# Patient Record
Sex: Female | Born: 1960 | Race: White | Hispanic: No | Marital: Married | Smoking: Never smoker
Health system: Southern US, Community
[De-identification: ages and names within clinical notes are randomized; demographics above are authoritative.]

## PROBLEM LIST (undated history)

## (undated) DIAGNOSIS — M8580 Other specified disorders of bone density and structure, unspecified site: Secondary | ICD-10-CM

## (undated) DIAGNOSIS — E559 Vitamin D deficiency, unspecified: Secondary | ICD-10-CM

## (undated) DIAGNOSIS — K5792 Diverticulitis of intestine, part unspecified, without perforation or abscess without bleeding: Secondary | ICD-10-CM

## (undated) HISTORY — PX: COLPOSCOPY: SHX161

## (undated) HISTORY — PX: KNEE SURGERY: SHX244

## (undated) HISTORY — DX: Other specified disorders of bone density and structure, unspecified site: M85.80

## (undated) HISTORY — DX: Vitamin D deficiency, unspecified: E55.9

## (undated) HISTORY — PX: FOOT SURGERY: SHX648

## (undated) HISTORY — PX: EXTERNAL EAR SURGERY: SHX627

---

## 2006-06-10 HISTORY — PX: BREAST EXCISIONAL BIOPSY: SUR124

## 2014-03-17 DIAGNOSIS — J309 Allergic rhinitis, unspecified: Secondary | ICD-10-CM | POA: Insufficient documentation

## 2014-03-17 DIAGNOSIS — R030 Elevated blood-pressure reading, without diagnosis of hypertension: Secondary | ICD-10-CM | POA: Insufficient documentation

## 2014-03-17 DIAGNOSIS — D72819 Decreased white blood cell count, unspecified: Secondary | ICD-10-CM | POA: Insufficient documentation

## 2014-03-17 DIAGNOSIS — G4700 Insomnia, unspecified: Secondary | ICD-10-CM | POA: Insufficient documentation

## 2014-03-17 DIAGNOSIS — K5900 Constipation, unspecified: Secondary | ICD-10-CM | POA: Insufficient documentation

## 2014-03-17 DIAGNOSIS — D126 Benign neoplasm of colon, unspecified: Secondary | ICD-10-CM | POA: Insufficient documentation

## 2014-03-17 DIAGNOSIS — R143 Flatulence: Secondary | ICD-10-CM | POA: Insufficient documentation

## 2014-03-17 DIAGNOSIS — F32A Depression, unspecified: Secondary | ICD-10-CM | POA: Insufficient documentation

## 2014-03-17 DIAGNOSIS — R350 Frequency of micturition: Secondary | ICD-10-CM | POA: Insufficient documentation

## 2014-03-17 DIAGNOSIS — R928 Other abnormal and inconclusive findings on diagnostic imaging of breast: Secondary | ICD-10-CM | POA: Insufficient documentation

## 2014-03-17 DIAGNOSIS — K589 Irritable bowel syndrome without diarrhea: Secondary | ICD-10-CM | POA: Insufficient documentation

## 2014-03-17 DIAGNOSIS — R197 Diarrhea, unspecified: Secondary | ICD-10-CM | POA: Insufficient documentation

## 2014-06-22 DIAGNOSIS — E559 Vitamin D deficiency, unspecified: Secondary | ICD-10-CM | POA: Insufficient documentation

## 2016-02-14 DIAGNOSIS — R9230 Dense breasts, unspecified: Secondary | ICD-10-CM | POA: Insufficient documentation

## 2016-02-14 DIAGNOSIS — Z9289 Personal history of other medical treatment: Secondary | ICD-10-CM | POA: Insufficient documentation

## 2017-05-12 ENCOUNTER — Other Ambulatory Visit: Payer: Self-pay

## 2017-05-12 ENCOUNTER — Emergency Department (HOSPITAL_BASED_OUTPATIENT_CLINIC_OR_DEPARTMENT_OTHER): Payer: BLUE CROSS/BLUE SHIELD

## 2017-05-12 ENCOUNTER — Encounter (HOSPITAL_BASED_OUTPATIENT_CLINIC_OR_DEPARTMENT_OTHER): Payer: Self-pay | Admitting: *Deleted

## 2017-05-12 ENCOUNTER — Emergency Department (HOSPITAL_BASED_OUTPATIENT_CLINIC_OR_DEPARTMENT_OTHER)
Admission: EM | Admit: 2017-05-12 | Discharge: 2017-05-12 | Disposition: A | Discharge status: Home / Self Care | Payer: BLUE CROSS/BLUE SHIELD | Attending: Emergency Medicine | Admitting: Emergency Medicine

## 2017-05-12 DIAGNOSIS — K529 Noninfective gastroenteritis and colitis, unspecified: Secondary | ICD-10-CM | POA: Insufficient documentation

## 2017-05-12 DIAGNOSIS — K5792 Diverticulitis of intestine, part unspecified, without perforation or abscess without bleeding: Secondary | ICD-10-CM | POA: Insufficient documentation

## 2017-05-12 DIAGNOSIS — R1031 Right lower quadrant pain: Secondary | ICD-10-CM | POA: Diagnosis present

## 2017-05-12 HISTORY — DX: Diverticulitis of intestine, part unspecified, without perforation or abscess without bleeding: K57.92

## 2017-05-12 LAB — COMPREHENSIVE METABOLIC PANEL
ALBUMIN: 4.7 g/dL (ref 3.5–5.0)
ALK PHOS: 66 U/L (ref 38–126)
ALT: 20 U/L (ref 14–54)
AST: 31 U/L (ref 15–41)
Anion gap: 9 (ref 5–15)
BUN: 13 mg/dL (ref 6–20)
CALCIUM: 9.5 mg/dL (ref 8.9–10.3)
CO2: 27 mmol/L (ref 22–32)
CREATININE: 0.64 mg/dL (ref 0.44–1.00)
Chloride: 100 mmol/L — ABNORMAL LOW (ref 101–111)
GFR calc Af Amer: >60 mL/min (ref >60)
GFR calc non Af Amer: >60 mL/min (ref >60)
GLUCOSE: 97 mg/dL (ref 65–99)
Potassium: 3.9 mmol/L (ref 3.5–5.1)
SODIUM: 136 mmol/L (ref 135–145)
TOTAL PROTEIN: 7.9 g/dL (ref 6.5–8.1)
Total Bilirubin: 0.6 mg/dL (ref 0.3–1.2)

## 2017-05-12 LAB — LIPASE, BLOOD: Lipase: 28 U/L (ref 11–51)

## 2017-05-12 LAB — CBC
HCT: 38.2 % (ref 36.0–46.0)
Hemoglobin: 12.4 g/dL (ref 12.0–15.0)
MCH: 32.6 pg (ref 26.0–34.0)
MCHC: 32.5 g/dL (ref 30.0–36.0)
MCV: 100.5 fL — ABNORMAL HIGH (ref 78.0–100.0)
PLATELETS: 258 10*3/uL (ref 150–400)
RBC: 3.8 MIL/uL — ABNORMAL LOW (ref 3.87–5.11)
RDW: 12.8 % (ref 11.5–15.5)
WBC: 7.8 10*3/uL (ref 4.0–10.5)

## 2017-05-12 LAB — URINALYSIS, ROUTINE W REFLEX MICROSCOPIC
BILIRUBIN URINE: NEGATIVE
Glucose, UA: NEGATIVE mg/dL
HGB URINE DIPSTICK: NEGATIVE
Ketones, ur: NEGATIVE mg/dL
Leukocytes, UA: NEGATIVE
NITRITE: NEGATIVE
PROTEIN: NEGATIVE mg/dL
Specific Gravity, Urine: 1.01 (ref 1.005–1.030)
pH: 7 (ref 5.0–8.0)

## 2017-05-12 MED ORDER — CIPROFLOXACIN HCL 500 MG PO TABS
500.0000 mg | ORAL_TABLET | Freq: Once | ORAL | Status: AC
  Administered 2017-05-12: 500 mg via ORAL
  Filled 2017-05-12: qty 1

## 2017-05-12 MED ORDER — ACETAMINOPHEN 500 MG PO TABS
1000.0000 mg | ORAL_TABLET | Freq: Once | ORAL | Status: AC
  Administered 2017-05-12: 1000 mg via ORAL
  Filled 2017-05-12: qty 2

## 2017-05-12 MED ORDER — SODIUM CHLORIDE 0.9 % IV BOLUS (SEPSIS)
1000.0000 mL | Freq: Once | INTRAVENOUS | Status: AC
  Administered 2017-05-12: 1000 mL via INTRAVENOUS

## 2017-05-12 MED ORDER — METRONIDAZOLE 500 MG PO TABS: 500.0000 mg | ORAL_TABLET | Freq: Two times a day (BID) | ORAL | 0 refills | Status: DC

## 2017-05-12 MED ORDER — CIPROFLOXACIN HCL 500 MG PO TABS: 500.0000 mg | ORAL_TABLET | Freq: Two times a day (BID) | ORAL | 0 refills | Status: DC

## 2017-05-12 MED ORDER — IOPAMIDOL (ISOVUE-300) INJECTION 61%
100.0000 mL | Freq: Once | INTRAVENOUS | Status: AC | PRN
  Administered 2017-05-12: 100 mL via INTRAVENOUS

## 2017-05-12 MED ORDER — METRONIDAZOLE 500 MG PO TABS
500.0000 mg | ORAL_TABLET | Freq: Once | ORAL | Status: AC
  Administered 2017-05-12: 500 mg via ORAL
  Filled 2017-05-12: qty 1

## 2017-05-12 NOTE — ED Notes (Signed)
Returned from CT.

## 2017-05-12 NOTE — ED Notes (Signed)
Patient transported to CT 

## 2017-05-12 NOTE — ED Provider Notes (Signed)
[facility] EMERGENCY DEPARTMENT Provider Note   CSN: [id number] Arrival date & time: 05/12/17  1830     History   Chief Complaint Chief Complaint  Patient presents with  . Abdominal Pain    HPI Shelby Patel is a 56 y.o. female history of diverticula on colonoscopy, he presented with right lower quadrant pain.  Patient has right lower quadrant pain since earlier today.  Patient had some constipation as well.  Patient has some fever and chills and felt nauseated.  Patient did have a colonoscopy before that showed diverticula and she had diverticulitis several years ago.  Denies any sick contacts or recent travel.   The history is provided by the patient.    Past Medical History:  Diagnosis Date  . Diverticulitis     There are no active problems to display for this patient.   Past Surgical History:  Procedure Laterality Date  . KNEE SURGERY      OB History    No data available       Home Medications    Prior to Admission medications   Not on File    Family History No family history on file.  Social History Social History   Tobacco Use  . Smoking status: Never Smoker  . Smokeless tobacco: Never Used  Substance Use Topics  . Alcohol use: Yes  . Drug use: No     Allergies   Penicillins   Review of Systems Review of Systems  Gastrointestinal: Positive for abdominal pain.  All other systems reviewed and are negative.    Physical Exam Updated Vital Signs BP 132/82 (BP Location: Left Arm)   Pulse 77   Temp 98.7 F (37.1 C) (Oral)   Resp 18   Ht 5\' 8"  (1.727 m)   Wt 58.5 kg (129 lb)   SpO2 99%   BMI 19.61 kg/m   Physical Exam  Constitutional: She appears well-developed and well-nourished.  HENT:  Head: Normocephalic.  Eyes: Pupils are equal, round, and reactive to light.  Cardiovascular: Normal rate.  Pulmonary/Chest: Effort normal.  Abdominal: Normal appearance and bowel sounds are normal. There is tenderness in the right  lower quadrant. There is tenderness at McBurney's point. There is no rebound and no guarding.  Neurological: She is alert.  Skin: Skin is warm. Capillary refill takes less than 2 seconds.  Nursing note and vitals reviewed.    ED Treatments / Results  Labs (all labs ordered are listed, but only abnormal results are displayed) Labs Reviewed  COMPREHENSIVE METABOLIC PANEL - Abnormal; Notable for the following components:      Result Value   Chloride 100 (*)    All other components within normal limits  CBC - Abnormal; Notable for the following components:   RBC 3.80 (*)    MCV 100.5 (*)    All other components within normal limits  LIPASE, BLOOD  URINALYSIS, ROUTINE W REFLEX MICROSCOPIC    EKG  EKG Interpretation None       Radiology Ct Abdomen Pelvis W Contrast  Result Date: 05/12/2017 CLINICAL DATA:  56 year old female with lower abdominal pain and fever today. Personal history of diverticulitis. EXAM: CT ABDOMEN AND PELVIS WITH CONTRAST TECHNIQUE: Multidetector CT imaging of the abdomen and pelvis was performed using the standard protocol following bolus administration of intravenous contrast. CONTRAST:  100mL ISOVUE-300 IOPAMIDOL (ISOVUE-300) INJECTION 61% COMPARISON:  None. FINDINGS: Lower chest: Negative lung bases. No pericardial or pleural effusion. Hepatobiliary: Negative liver and gallbladder. Pancreas: Negative. Spleen: Negative.  Adrenals/Urinary Tract: Normal adrenal glands. Bilateral renal enhancement and contrast excretion is symmetric and within normal limits. No dilatation of the left ureter. Unremarkable urinary bladder. Stomach/Bowel: Negative rectum.  Redundant sigmoid colon. Severe wall thickening in the proximal sigmoid and surrounding mesenteric inflammation along a segment of about 9 cm. Sigmoid wall thickening up to 15 mm. There is at least 1 relatively large sigmoid diverticula in the region (sagittal image 51). No extraluminal gas identified. At the junction  with the descending colon the inflammation abates. There are multiple small diverticula in the region which continue into the distal left colon. Increased retained stool in the left colon and transverse colon with redundant splenic flexure. Redundant hepatic flexure with retained stool. Oral contrast has reached the ascending colon. Negative terminal ileum. Normal appendix (coronal image 24). No other large bowel inflammation identified. No dilated or inflamed small bowel loops. Decompressed and negative stomach and duodenum. No abdominal free fluid. No free air identified. Vascular/Lymphatic: Major arterial structures in the abdomen and pelvis are patent. Major venous structures also appear patent. The portal venous system appears patent. No lymphadenopathy. Reproductive: Retroverted uterus. The left ovary is in proximity to the abnormal sigmoid colon but appears to remain normal. Uterus and right ovary appear negative. Other: Small volume pelvic free fluid. Musculoskeletal: Mild levoconvex lumbar curvature. No acute osseous abnormality identified. IMPRESSION: 1. Severely inflamed 9 cm segment of the proximal sigmoid colon. Diverticulae in the region and also moderate to severe circumferential colonic wall thickening. Appearance compatible with combined acute diverticulitis and colitis. 2. Small volume of probably reactive free fluid in the pelvis. No evidence of perforation or abscess at this time. 3. No other acute or inflammatory process identified. Electronically Signed   By: Odessa FlemingH  Hall M.D.   On: 05/12/2017 22:20    Procedures Procedures (including critical care time)  Medications Ordered in ED Medications  acetaminophen (TYLENOL) tablet 1,000 mg (1,000 mg Oral Given 05/12/17 1929)  sodium chloride 0.9 % bolus 1,000 mL (0 mLs Intravenous Stopped 05/12/17 2237)  iopamidol (ISOVUE-300) 61 % injection 100 mL (100 mLs Intravenous Contrast Given 05/12/17 2132)  ciprofloxacin (CIPRO) tablet 500 mg (500 mg Oral  Given 05/12/17 2236)  metroNIDAZOLE (FLAGYL) tablet 500 mg (500 mg Oral Given 05/12/17 2236)     Initial Impression / Assessment and Plan / ED Course  I have reviewed the triage vital signs and the nursing notes.  Pertinent labs & imaging results that were available during my care of the patient were reviewed by me and considered in my medical decision making (see chart for details).     Shelby CallanderJanet Group is a 56 y.o. female here with RLQ pain, constipation. Consider appy vs diverticulitis vs gastro. Will get labs, UA, CT ab/pel.   10:50 PM WBC nl. CT showed proximal sigmoid colon inflammation with colitis and diverticulitis and small free fluid with no perforation. Pain controlled currently. She has seen GI in the past. She was told that she may have Crohn's. Will try cipro, flagyl. Will have her follow up with GI for another colonoscopy    Final Clinical Impressions(s) / ED Diagnoses   Final diagnoses:  None    ED Discharge Orders    None       Charlynne PanderYao, Notnamed Croucher Hsienta, MD 05/12/17 2253

## 2017-05-12 NOTE — ED Triage Notes (Signed)
Lower abdominal pain today. Fever. 2 abnormal BM's today. Hx of diverticulitis.

## 2017-05-12 NOTE — Discharge Instructions (Signed)
Take cipro, flagyl twice daily for a week for diverticulitis.   Take tylenol, motrin for pain.   Please follow up with your GI doctor. You likely need another colonoscopy and workup for possible Crohn's disease   Return to ER if you have severe abdominal pain, vomiting, fever.

## 2019-06-27 ENCOUNTER — Emergency Department (INDEPENDENT_AMBULATORY_CARE_PROVIDER_SITE_OTHER)
Admission: EM | Admit: 2019-06-27 | Discharge: 2019-06-27 | Disposition: A | Discharge status: Home / Self Care | Payer: BC Managed Care – PPO | Source: Home / Self Care | Attending: Family Medicine | Admitting: Family Medicine

## 2019-06-27 ENCOUNTER — Encounter: Payer: Self-pay | Admitting: Emergency Medicine

## 2019-06-27 ENCOUNTER — Other Ambulatory Visit: Payer: Self-pay

## 2019-06-27 DIAGNOSIS — R300 Dysuria: Secondary | ICD-10-CM | POA: Diagnosis not present

## 2019-06-27 DIAGNOSIS — N3090 Cystitis, unspecified without hematuria: Secondary | ICD-10-CM

## 2019-06-27 LAB — POCT URINALYSIS DIP (MANUAL ENTRY)
Bilirubin, UA: NEGATIVE
Glucose, UA: NEGATIVE mg/dL
Ketones, POC UA: NEGATIVE mg/dL
Nitrite, UA: NEGATIVE
Protein Ur, POC: NEGATIVE mg/dL
Spec Grav, UA: 1.02 (ref 1.010–1.025)
Urobilinogen, UA: 0.2 E.U./dL
pH, UA: 7.5 (ref 5.0–8.0)

## 2019-06-27 MED ORDER — NITROFURANTOIN MONOHYD MACRO 100 MG PO CAPS: 100.0000 mg | ORAL_CAPSULE | Freq: Two times a day (BID) | ORAL | 0 refills | Status: DC

## 2019-06-27 NOTE — Discharge Instructions (Addendum)
Increase fluid intake. May use non-prescription AZO for about two days, if desired, to decrease urinary discomfort.  If symptoms become significantly worse during the night or over the weekend, proceed to the local emergency room.  

## 2019-06-27 NOTE — ED Triage Notes (Signed)
Patient reports onset of urinary frequency with some burning upon urination starting yesterday. This is rare for her. Has had influenza vacc this season. No known exposure to covid positive person.

## 2019-06-27 NOTE — ED Provider Notes (Signed)
[facility]    CSN: [id number] Arrival date & time: 06/27/19  1044      History   Chief Complaint Chief Complaint  Patient presents with  . Urinary Frequency  . Dysuria    HPI Shelby Patel is a 59 y.o. female.   Patient developed urinary frequency, dysuria, and low abdominal pressure sensation yesterday.  She denies fevers, chills, and sweats, back pain, and nausea/vomiting.  The history is provided by the patient.  Dysuria Pain quality:  Burning Pain severity:  Mild Onset quality:  Sudden Duration:  1 day Timing:  Constant Progression:  Worsening Chronicity:  New Recent urinary tract infections: no   Relieved by:  Nothing Worsened by:  Nothing Ineffective treatments:  None tried Urinary symptoms: frequent urination and hesitancy   Urinary symptoms: no discolored urine, no foul-smelling urine, no hematuria and no bladder incontinence   Associated symptoms: no abdominal pain, no fever, no flank pain, no genital lesions, no nausea, no vaginal discharge and no vomiting     Past Medical History:  Diagnosis Date  . Diverticulitis     There are no problems to display for this patient.   Past Surgical History:  Procedure Laterality Date  . EXTERNAL EAR SURGERY Left   . FOOT SURGERY Bilateral   . KNEE SURGERY    . KNEE SURGERY Right     OB History   No obstetric history on file.      Home Medications    Prior to Admission medications   Medication Sig Start Date End Date Taking? Authorizing Provider  metroNIDAZOLE (FLAGYL) 500 MG tablet Take 1 tablet (500 mg total) by mouth 2 (two) times daily. One po bid x 7 days 05/12/17   Charlynne Pander, MD  nitrofurantoin, macrocrystal-monohydrate, (MACROBID) 100 MG capsule Take 1 capsule (100 mg total) by mouth 2 (two) times daily. Take with food. 06/27/19   Lattie Haw, MD    Family History No family history on file.  Social History Social History   Tobacco Use  . Smoking status: Never  Smoker  . Smokeless tobacco: Never Used  Substance Use Topics  . Alcohol use: Yes  . Drug use: No     Allergies   Penicillins   Review of Systems Review of Systems  Constitutional: Negative for chills, diaphoresis, fatigue and fever.  Gastrointestinal: Negative for abdominal pain, nausea and vomiting.  Genitourinary: Positive for dysuria, frequency and urgency. Negative for flank pain and vaginal discharge.  All other systems reviewed and are negative.    Physical Exam Triage Vital Signs ED Triage Vitals  Enc Vitals Group     BP 06/27/19 1129 (!) 167/89     Pulse Rate 06/27/19 1129 61     Resp 06/27/19 1129 16     Temp 06/27/19 1129 98 F (36.7 C)     Temp Source 06/27/19 1129 Oral     SpO2 06/27/19 1129 98 %     Weight 06/27/19 1130 120 lb (54.4 kg)     Height 06/27/19 1130 5\' 8"  (1.727 m)     Head Circumference --      Peak Flow --      Pain Score 06/27/19 1129 1     Pain Loc --      Pain Edu? --      Excl. in GC? --    No data found.  Updated Vital Signs BP (!) 167/89 (BP Location: Right Arm)   Pulse 61   Temp 98 F (  36.7 C) (Oral)   Resp 16   Ht 5\' 8"  (1.727 m)   Wt 54.4 kg   SpO2 98%   BMI 18.25 kg/m   Visual Acuity Right Eye Distance:   Left Eye Distance:   Bilateral Distance:    Right Eye Near:   Left Eye Near:    Bilateral Near:     Physical Exam Nursing notes and Vital Signs reviewed. Appearance:  Patient appears stated age, and in no acute distress.    Eyes:  Pupils are equal, round, and reactive to light and accomodation.  Extraocular movement is intact.  Conjunctivae are not inflamed   Pharynx:  Normal; moist mucous membranes  Neck:  Supple.  No adenopathy Lungs:  Clear to auscultation.  Breath sounds are equal.  Moving air well. Heart:  Regular rate and rhythm without murmurs, rubs, or gallops.  Abdomen:  Nontender without masses or hepatosplenomegaly.  Bowel sounds are present.  No CVA or flank tenderness.  Extremities:  No edema.   Skin:  No rash present.     UC Treatments / Results  Labs (all labs ordered are listed, but only abnormal results are displayed) Labs Reviewed  POCT URINALYSIS DIP (MANUAL ENTRY) - Abnormal; Notable for the following components:      Result Value   Color, UA light yellow (*)    Blood, UA trace-intact (*)    Leukocytes, UA Trace (*)    All other components within normal limits  URINE CULTURE    EKG   Radiology No results found.  Procedures Procedures (including critical care time)  Medications Ordered in UC Medications - No data to display  Initial Impression / Assessment and Plan / UC Course  I have reviewed the triage vital signs and the nursing notes.  Pertinent labs & imaging results that were available during my care of the patient were reviewed by me and considered in my medical decision making (see chart for details).    Urine culture pending. Begin Macrobid. Followup with Family Doctor if not improved in about 8 days.   Final Clinical Impressions(s) / UC Diagnoses   Final diagnoses:  Dysuria  Cystitis     Discharge Instructions     Increase fluid intake. May use non-prescription AZO for about two days, if desired, to decrease urinary discomfort.   If symptoms become significantly worse during the night or over the weekend, proceed to the local emergency room.    ED Prescriptions    Medication Sig Dispense Auth. Provider   nitrofurantoin, macrocrystal-monohydrate, (MACROBID) 100 MG capsule Take 1 capsule (100 mg total) by mouth 2 (two) times daily. Take with food. 14 capsule Kandra Nicolas, MD        Kandra Nicolas, MD 07/02/19 [phone]

## 2019-06-28 LAB — URINE CULTURE
MICRO NUMBER:: 10051637
SPECIMEN QUALITY:: ADEQUATE

## 2019-07-05 ENCOUNTER — Emergency Department (HOSPITAL_BASED_OUTPATIENT_CLINIC_OR_DEPARTMENT_OTHER)
Admission: EM | Admit: 2019-07-05 | Discharge: 2019-07-05 | Disposition: A | Discharge status: Home / Self Care | Payer: BC Managed Care – PPO | Attending: Emergency Medicine | Admitting: Emergency Medicine

## 2019-07-05 ENCOUNTER — Other Ambulatory Visit: Payer: Self-pay

## 2019-07-05 ENCOUNTER — Encounter (HOSPITAL_BASED_OUTPATIENT_CLINIC_OR_DEPARTMENT_OTHER): Payer: Self-pay | Admitting: *Deleted

## 2019-07-05 ENCOUNTER — Emergency Department (HOSPITAL_BASED_OUTPATIENT_CLINIC_OR_DEPARTMENT_OTHER): Payer: BC Managed Care – PPO

## 2019-07-05 DIAGNOSIS — R1031 Right lower quadrant pain: Secondary | ICD-10-CM | POA: Insufficient documentation

## 2019-07-05 DIAGNOSIS — R1032 Left lower quadrant pain: Secondary | ICD-10-CM | POA: Insufficient documentation

## 2019-07-05 DIAGNOSIS — Z880 Allergy status to penicillin: Secondary | ICD-10-CM | POA: Diagnosis not present

## 2019-07-05 DIAGNOSIS — M545 Low back pain, unspecified: Secondary | ICD-10-CM

## 2019-07-05 DIAGNOSIS — R350 Frequency of micturition: Secondary | ICD-10-CM | POA: Diagnosis not present

## 2019-07-05 DIAGNOSIS — R1030 Lower abdominal pain, unspecified: Secondary | ICD-10-CM

## 2019-07-05 LAB — CBC WITH DIFFERENTIAL/PLATELET
Abs Immature Granulocytes: 0.03 10*3/uL (ref 0.00–0.07)
Basophils Absolute: 0 10*3/uL (ref 0.0–0.1)
Basophils Relative: 0 %
Eosinophils Absolute: 0 10*3/uL (ref 0.0–0.5)
Eosinophils Relative: 0 %
HCT: 40.7 % (ref 36.0–46.0)
Hemoglobin: 13.1 g/dL (ref 12.0–15.0)
Immature Granulocytes: 0 %
Lymphocytes Relative: 2 %
Lymphs Abs: 0.2 10*3/uL — ABNORMAL LOW (ref 0.7–4.0)
MCH: 32.9 pg (ref 26.0–34.0)
MCHC: 32.2 g/dL (ref 30.0–36.0)
MCV: 102.3 fL — ABNORMAL HIGH (ref 80.0–100.0)
Monocytes Absolute: 0.4 10*3/uL (ref 0.1–1.0)
Monocytes Relative: 5 %
Neutro Abs: 8.4 10*3/uL — ABNORMAL HIGH (ref 1.7–7.7)
Neutrophils Relative %: 93 %
Platelets: 248 10*3/uL (ref 150–400)
RBC: 3.98 MIL/uL (ref 3.87–5.11)
RDW: 12.1 % (ref 11.5–15.5)
WBC: 9.1 10*3/uL (ref 4.0–10.5)
nRBC: 0 % (ref 0.0–0.2)

## 2019-07-05 LAB — BASIC METABOLIC PANEL
Anion gap: 9 (ref 5–15)
BUN: 14 mg/dL (ref 6–20)
CO2: 26 mmol/L (ref 22–32)
Calcium: 9.7 mg/dL (ref 8.9–10.3)
Chloride: 99 mmol/L (ref 98–111)
Creatinine, Ser: 0.61 mg/dL (ref 0.44–1.00)
GFR calc Af Amer: >60 mL/min (ref >60)
GFR calc non Af Amer: >60 mL/min (ref >60)
Glucose, Bld: 122 mg/dL — ABNORMAL HIGH (ref 70–99)
Potassium: 4.2 mmol/L (ref 3.5–5.1)
Sodium: 134 mmol/L — ABNORMAL LOW (ref 135–145)

## 2019-07-05 LAB — URINALYSIS, ROUTINE W REFLEX MICROSCOPIC
Bilirubin Urine: NEGATIVE
Glucose, UA: NEGATIVE mg/dL
Ketones, ur: 15 mg/dL — AB
Leukocytes,Ua: NEGATIVE
Nitrite: NEGATIVE
Protein, ur: NEGATIVE mg/dL
Specific Gravity, Urine: 1.01 (ref 1.005–1.030)
pH: 6 (ref 5.0–8.0)

## 2019-07-05 LAB — URINALYSIS, MICROSCOPIC (REFLEX)

## 2019-07-05 MED ORDER — ACETAMINOPHEN 325 MG PO TABS
650.0000 mg | ORAL_TABLET | Freq: Once | ORAL | Status: AC
Start: 2019-07-05 — End: 2019-07-05
  Administered 2019-07-05: 15:00:00 650 mg via ORAL
  Filled 2019-07-05: qty 2

## 2019-07-05 MED ORDER — IOHEXOL 300 MG/ML  SOLN
100.0000 mL | Freq: Once | INTRAMUSCULAR | Status: AC | PRN
  Administered 2019-07-05: 100 mL via INTRAVENOUS

## 2019-07-05 NOTE — Discharge Instructions (Addendum)
As discussed, all of your labs and CT scan looked good. Your urine did not show signs of a urinary tract infection. 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 If you continue to have pain, you may want to follow-up with your OBGYN. Finish taking your antibiotic for your UTI. Make sure to report to your GI appointment on Wednesday and discuss your pain with them. You may take over the counter ibuprofen or Tylenol as needed for pain. Return to the ER for new or worsening symptoms.

## 2019-07-05 NOTE — ED Provider Notes (Signed)
[facility] Provider Note   CSN: [id number] Arrival date & time: 07/05/19  1314     History Chief Complaint  Patient presents with   Abdominal Pain    Shelby Patel is a 59 y.o. female with a past medical history significant for diverticulitis who presents to the ED due to sudden onset of bilateral abdominal pain that radiates into her low back that started this morning around 6:30AM.  Patient describes the pain as a dull and achy sensation and rates it an 8.5/10.  She admits to having a history of diverticulitis and notes this episode feels similar.  Patient denies vomiting and diarrhea, but admits to intermittent nausea.  Chart reviewed.  Patient was seen at [facility] urgent care on 06/27/2019 where she was diagnosed with a urinary tract infection and prescribed Macrobid.  Patient states she skipped roughly 3 days of her antibiotic; however, took 1 pill early this morning when she began to feel her abdominal pain.  She notes her urinary symptoms completely resolved which is why she stopped the antibiotics.  Patient denies dysuria, but admits to increased urinary frequency which she attributes to drinking more fluids.  Patient admits to having 4 previous colonoscopies which were significant for polyps her last one being roughly 5 years ago.  Patient has a scheduled GI appointment on Wednesday.  Patient also admits to a bilateral frontal headache and fever.  T-max 100.7 earlier this morning. Patient denies hematochezia, melena, and vaginal symptoms. She is currently sexually active with her husband with no concern for STDs at this time. Patient denies history of kidney stones. Patient had COVID early December of last year. No treatment prior to arrival. Patient denies saddle paresthesias, numbness/tingling of lower extremities, lower extremity weakness, and bowel/bladder incontinence.      Past Medical History:  Diagnosis Date   Diverticulitis     There are no  problems to display for this patient.   Past Surgical History:  Procedure Laterality Date   EXTERNAL EAR SURGERY Left    FOOT SURGERY Bilateral    KNEE SURGERY     KNEE SURGERY Right      OB History   No obstetric history on file.     No family history on file.  Social History   Tobacco Use   Smoking status: Never Smoker   Smokeless tobacco: Never Used  Substance Use Topics   Alcohol use: Yes   Drug use: No    Home Medications Prior to Admission medications   Medication Sig Start Date End Date Taking? Authorizing Provider  nitrofurantoin, macrocrystal-monohydrate, (MACROBID) 100 MG capsule Take 1 capsule (100 mg total) by mouth 2 (two) times daily. Take with food. 06/27/19  Yes Kandra Nicolas, MD  metroNIDAZOLE (FLAGYL) 500 MG tablet Take 1 tablet (500 mg total) by mouth 2 (two) times daily. One po bid x 7 days 05/12/17   Drenda Freeze, MD    Allergies    Penicillins  Review of Systems   Review of Systems  Constitutional: Positive for chills and fever.  Respiratory: Negative for cough and shortness of breath.   Cardiovascular: Negative for chest pain and leg swelling.  Gastrointestinal: Positive for abdominal pain and nausea. Negative for diarrhea and vomiting.  Genitourinary: Positive for frequency. Negative for dysuria, vaginal bleeding, vaginal discharge and vaginal pain.  Musculoskeletal: Positive for back pain (bilateral low back pain).  Neurological: Positive for headaches. Negative for weakness.  All other systems reviewed and are negative.  Physical Exam Updated Vital Signs BP (!) 152/100 (BP Location: Right Arm)    Pulse 93    Temp 100.1 F (37.8 C) (Oral)    Resp 15    Ht 5\' 8"  (1.727 m)    Wt 54.4 kg    SpO2 98%    BMI 18.25 kg/m   Physical Exam Vitals and nursing note reviewed.  Constitutional:      General: She is not in acute distress.    Appearance: She is not ill-appearing.  HENT:     Head: Normocephalic.  Eyes:     Pupils:  Pupils are equal, round, and reactive to light.  Cardiovascular:     Rate and Rhythm: Normal rate and regular rhythm.     Pulses: Normal pulses.     Heart sounds: Normal heart sounds. No murmur. No friction rub. No gallop.   Pulmonary:     Effort: Pulmonary effort is normal.     Breath sounds: Normal breath sounds.  Abdominal:     General: Abdomen is flat. Bowel sounds are normal. There is no distension.     Palpations: Abdomen is soft.     Tenderness: There is abdominal tenderness. There is no right CVA tenderness, left CVA tenderness, guarding or rebound.     Comments: Very mild generalized abdominal tenderness. No focal tenderness. No rebound. No guarding. No CVA tenderness bilaterally.   Musculoskeletal:     Cervical back: Neck supple.     Comments: No thoracic or lumbar midline tenderness. Bilateral lumbar paraspinal tenderness. Distal pulses and sensation intact.   Skin:    General: Skin is warm and dry.  Neurological:     General: No focal deficit present.     Mental Status: She is alert.  Psychiatric:        Mood and Affect: Mood normal.        Behavior: Behavior normal.     ED Results / Procedures / Treatments   Labs (all labs ordered are listed, but only abnormal results are displayed) Labs Reviewed  URINALYSIS, ROUTINE W REFLEX MICROSCOPIC - Abnormal; Notable for the following components:      Result Value   Hgb urine dipstick TRACE (*)    Ketones, ur 15 (*)    All other components within normal limits  CBC WITH DIFFERENTIAL/PLATELET - Abnormal; Notable for the following components:   MCV 102.3 (*)    Neutro Abs 8.4 (*)    Lymphs Abs 0.2 (*)    All other components within normal limits  BASIC METABOLIC PANEL - Abnormal; Notable for the following components:   Sodium 134 (*)    Glucose, Bld 122 (*)    All other components within normal limits  URINALYSIS, MICROSCOPIC (REFLEX) - Abnormal; Notable for the following components:   Bacteria, UA RARE (*)    All  other components within normal limits    EKG None  Radiology CT ABDOMEN PELVIS W CONTRAST  Result Date: 07/05/2019 CLINICAL DATA:  Low abdominal and low back pain. Recently treated for urinary tract infection with antibiotics. History of diverticulitis. EXAM: CT ABDOMEN AND PELVIS WITH CONTRAST TECHNIQUE: Multidetector CT imaging of the abdomen and pelvis was performed using the standard protocol following bolus administration of intravenous contrast. CONTRAST:  07/07/2019 OMNIPAQUE IOHEXOL 300 MG/ML  SOLN COMPARISON:  Noncontrast abdominopelvic CT 10/03/2017. FINDINGS: Lower chest: Clear lung bases. No significant pleural or pericardial effusion. Hepatobiliary: The liver is normal in density without suspicious focal abnormality. No evidence of gallstones, gallbladder wall thickening  or biliary dilatation. Pancreas: Unremarkable. No pancreatic ductal dilatation or surrounding inflammatory changes. Spleen: Normal in size without focal abnormality. Adrenals/Urinary Tract: Both adrenal glands appear normal. No evidence of urinary tract calculus or hydronephrosis. Both kidneys appear normal. Mild bladder wall thickening versus incomplete distention. Stomach/Bowel: No evidence of bowel wall thickening, distention or surrounding inflammatory change. The appendix appears normal. Minimal distal colonic diverticulosis without surrounding inflammation. Vascular/Lymphatic: There are no enlarged abdominal or pelvic lymph nodes. No significant vascular findings. Reproductive: Stable fat containing right ovarian dermoid measuring 2.3 cm on image 60/2. The uterus and ovaries otherwise appear unremarkable. Other: No evidence of abdominal wall mass or hernia. No ascites. Musculoskeletal: No acute or significant osseous findings. There is degenerative disc disease at L5-S1. IMPRESSION: 1. No acute findings or explanation for the patient's symptoms. No evidence of diverticulitis. 2. Mild bladder wall thickening versus incomplete  distention. 3. Stable small right ovarian dermoid. 4. Degenerative disc disease at L5-S1. Electronically Signed   By: Carey Bullocks M.D.   On: 07/05/2019 15:27    Procedures Procedures (including critical care time)  Medications Ordered in ED Medications  acetaminophen (TYLENOL) tablet 650 mg (650 mg Oral Given 07/05/19 1517)  iohexol (OMNIPAQUE) 300 MG/ML solution 100 mL (100 mLs Intravenous Contrast Given 07/05/19 1506)    ED Course  I have reviewed the triage vital signs and the nursing notes.  Pertinent labs & imaging results that were available during my care of the patient were reviewed by me and considered in my medical decision making (see chart for details).    MDM Rules/Calculators/A&P                     59 year old female presents to the ED for an evaluation of bilateral lower abdominal pain which radiates into her bilateral low back region associated with nausea. Patient has a history of diverticulitis and notes this feels similar to her previous episodes. Patient was recently treated at [facility] for a UTI on 06/27/19 where she was prescribed Macrobid, but did not finish the entire course. Upon arrival patient borderline febrile at 100.1, tachycardic at 111 with otherwise reassuring vitals signs. Patient in no acute distress and non-ill appearing. Physical exam reassuring with only very mild generalized abdominal tenderness. No rebound. No guarding. No CVA tenderness bilaterally. Mild lumbar paraspinal tenderness. No midline tenderness. Lower extremities neurovascularly intact. No concern for cauda equina or central cord compression. Routine labs ordered at triage. Will obtain CT abdomen/pelvis given her hx of diverticulitis to rule out intraabdominal infection. Discussed case with Dr. Madilyn Hook who agrees with assessment and plan.  BMP significant for hyponatremia at 134 and mild hyperglycemia at 122, but otherwise unremarkable.  CBC without leukocytosis.  UA significant for trace hematuria  and ketonuria, but no signs of infection.  Doubt UTI/pyelonephritis. CT scan personally reviewed which demonstrates: 1. No acute findings or explanation for the patient's symptoms. No  evidence of diverticulitis.  2. Mild bladder wall thickening versus incomplete distention.  3. Stable small right ovarian dermoid.  4. Degenerative disc disease at L5-S1.   Discussed results with patient. Advised patient to follow-up with her GI doctor at her scheduled appointment on Wednesday. Informed patient about her stable right ovarian dermoid and advised her to follow-up with her OBGYN if her symptoms continue. Instructed patient to take over the counter ibuprofen or tylenol as needed for pain. Strict ED precautions discussed with patient. Patient states understanding and agrees to plan. Patient discharged home in no acute distress  and stable vitals  Final Clinical Impression(s) / ED Diagnoses Final diagnoses:  Lower abdominal pain  Acute bilateral low back pain without sciatica    Rx / DC Orders ED Discharge Orders    None       Jesusita Oka 07/05/19 1607    Tilden Fossa, MD 07/06/19 [phone]

## 2019-07-05 NOTE — ED Triage Notes (Signed)
Lower abdominal pain and lower back pain. She was treated for a UTI last week at [facility]. She did not complete her antibiotic but took one of the left over pills this am.

## 2019-07-06 ENCOUNTER — Other Ambulatory Visit: Payer: Self-pay

## 2019-07-06 ENCOUNTER — Encounter (HOSPITAL_BASED_OUTPATIENT_CLINIC_OR_DEPARTMENT_OTHER): Payer: Self-pay | Admitting: *Deleted

## 2019-07-06 ENCOUNTER — Emergency Department (HOSPITAL_BASED_OUTPATIENT_CLINIC_OR_DEPARTMENT_OTHER)
Admission: EM | Admit: 2019-07-06 | Discharge: 2019-07-06 | Disposition: A | Discharge status: Home / Self Care | Payer: BC Managed Care – PPO | Attending: Emergency Medicine | Admitting: Emergency Medicine

## 2019-07-06 ENCOUNTER — Emergency Department (HOSPITAL_BASED_OUTPATIENT_CLINIC_OR_DEPARTMENT_OTHER): Payer: BC Managed Care – PPO

## 2019-07-06 DIAGNOSIS — R509 Fever, unspecified: Secondary | ICD-10-CM | POA: Diagnosis present

## 2019-07-06 LAB — RAPID INFLUENZA A&B ANTIGENS
Influenza A (ARMC): NEGATIVE
Influenza B (ARMC): NEGATIVE

## 2019-07-06 NOTE — ED Notes (Signed)
ED Provider at bedside. 

## 2019-07-06 NOTE — ED Provider Notes (Signed)
[facility] Provider Note   CSN: [id number] Arrival date & time: 07/06/19  2103     History Chief Complaint  Patient presents with  . Fever    Lundynn Cohoon is a 59 y.o. female.  Patient is a 59 year old female with a past medical history of diverticulitis presenting today with recurrent fever.  Patient was seen in the emergency room yesterday because at 6:30 in the morning when she woke up she had significant abdominal pain, intermittent fevers and not feeling well.  In the emergency room she had blood work, urinalysis and an abdominal CT all without significant findings.  Was discharged home and states today she just keeps having recurrent fever.  She had rigors tonight and a temperature up to 104.  She has been taking Tylenol and ibuprofen for her fever and other than having a headache when her fever spikes she denies any neck pain, back pain, chest pain, shortness of breath, cough, leg pain, swelling or redness.  She denies any sick contacts at home.  Patient states she has not had any further dysuria.  She denies having any hardware in her back or any history of valve replacements.  She has had no recent travel or bad food exposure.  No diarrhea or vomiting but did have some nausea earlier with elevated temperatures.  Patient was Covid positive at the beginning of December 2020 and had multiple positive tests after symptoms resolved.  The history is provided by the patient.       Past Medical History:  Diagnosis Date  . Diverticulitis     There are no problems to display for this patient.   Past Surgical History:  Procedure Laterality Date  . EXTERNAL EAR SURGERY Left   . FOOT SURGERY Bilateral   . KNEE SURGERY    . KNEE SURGERY Right      OB History   No obstetric history on file.     No family history on file.  Social History   Tobacco Use  . Smoking status: Never Smoker  . Smokeless tobacco: Never Used  Substance Use Topics  .  Alcohol use: Yes  . Drug use: No    Home Medications Prior to Admission medications   Medication Sig Start Date End Date Taking? Authorizing Provider  metroNIDAZOLE (FLAGYL) 500 MG tablet Take 1 tablet (500 mg total) by mouth 2 (two) times daily. One po bid x 7 days 05/12/17   Drenda Freeze, MD  nitrofurantoin, macrocrystal-monohydrate, (MACROBID) 100 MG capsule Take 1 capsule (100 mg total) by mouth 2 (two) times daily. Take with food. 06/27/19   Kandra Nicolas, MD    Allergies    Penicillins  Review of Systems   Review of Systems  All other systems reviewed and are negative.   Physical Exam Updated Vital Signs BP (!) 148/77   Pulse (!) 102   Temp (!) 100.5 F (38.1 C) (Oral)   Resp 20   Ht 5\' 8"  (1.727 m)   Wt 55.8 kg   SpO2 96%   BMI 18.70 kg/m   Physical Exam Vitals and nursing note reviewed.  Constitutional:      General: She is not in acute distress.    Appearance: Normal appearance. She is well-developed and normal weight.  HENT:     Head: Normocephalic and atraumatic.  Eyes:     Pupils: Pupils are equal, round, and reactive to light.  Cardiovascular:     Rate and Rhythm: Regular rhythm. Tachycardia  present.     Pulses: Normal pulses.     Heart sounds: Normal heart sounds. No murmur. No friction rub.  Pulmonary:     Effort: Pulmonary effort is normal.     Breath sounds: Normal breath sounds. No wheezing or rales.  Abdominal:     General: Bowel sounds are normal. There is no distension.     Palpations: Abdomen is soft.     Tenderness: There is no abdominal tenderness. There is no right CVA tenderness, left CVA tenderness, guarding or rebound.  Musculoskeletal:        General: No tenderness. Normal range of motion.     Cervical back: Normal range of motion and neck supple. No tenderness. No spinous process tenderness or muscular tenderness.     Right lower leg: No edema.     Left lower leg: No edema.     Comments: No edema.  No spinal tenderness    Skin:    General: Skin is warm and dry.     Capillary Refill: Capillary refill takes less than 2 seconds.     Findings: No rash.  Neurological:     General: No focal deficit present.     Mental Status: She is alert and oriented to person, place, and time.     Cranial Nerves: No cranial nerve deficit.  Psychiatric:        Mood and Affect: Mood normal.        Behavior: Behavior normal.        Thought Content: Thought content normal.     ED Results / Procedures / Treatments   Labs (all labs ordered are listed, but only abnormal results are displayed) Labs Reviewed  RAPID INFLUENZA A&B ANTIGENS  CULTURE, BLOOD (ROUTINE X 2)  CULTURE, BLOOD (ROUTINE X 2)    EKG None  Radiology DG Chest 2 View  Result Date: 07/06/2019 CLINICAL DATA:  Abdominal pain EXAM: CHEST - 2 VIEW COMPARISON:  None. FINDINGS: The heart size and mediastinal contours are within normal limits. Both lungs are clear. The visualized skeletal structures are unremarkable. IMPRESSION: Negative Electronically Signed   By: Charlett Nose M.D.   On: 07/06/2019 22:30   CT ABDOMEN PELVIS W CONTRAST  Result Date: 07/05/2019 CLINICAL DATA:  Low abdominal and low back pain. Recently treated for urinary tract infection with antibiotics. History of diverticulitis. EXAM: CT ABDOMEN AND PELVIS WITH CONTRAST TECHNIQUE: Multidetector CT imaging of the abdomen and pelvis was performed using the standard protocol following bolus administration of intravenous contrast. CONTRAST:  OMNIPAQUE IOHEXOL 300 MG/ML  SOLN COMPARISON:  Noncontrast abdominopelvic CT 10/03/2017. FINDINGS: Lower chest: Clear lung bases. No significant pleural or pericardial effusion. Hepatobiliary: The liver is normal in density without suspicious focal abnormality. No evidence of gallstones, gallbladder wall thickening or biliary dilatation. Pancreas: Unremarkable. No pancreatic ductal dilatation or surrounding inflammatory changes. Spleen: Normal in size without  focal abnormality. Adrenals/Urinary Tract: Both adrenal glands appear normal. No evidence of urinary tract calculus or hydronephrosis. Both kidneys appear normal. Mild bladder wall thickening versus incomplete distention. Stomach/Bowel: No evidence of bowel wall thickening, distention or surrounding inflammatory change. The appendix appears normal. Minimal distal colonic diverticulosis without surrounding inflammation. Vascular/Lymphatic: There are no enlarged abdominal or pelvic lymph nodes. No significant vascular findings. Reproductive: Stable fat containing right ovarian dermoid measuring 2.3 cm on image 60/2. The uterus and ovaries otherwise appear unremarkable. Other: No evidence of abdominal wall mass or hernia. No ascites. Musculoskeletal: No acute or significant osseous findings. There is  degenerative disc disease at L5-S1. IMPRESSION: 1. No acute findings or explanation for the patient's symptoms. No evidence of diverticulitis. 2. Mild bladder wall thickening versus incomplete distention. 3. Stable small right ovarian dermoid. 4. Degenerative disc disease at L5-S1. Electronically Signed   By: Carey Bullocks M.D.   On: 07/05/2019 15:27    Procedures Procedures (including critical care time)  Medications Ordered in ED Medications - No data to display  ED Course  I have reviewed the triage vital signs and the nursing notes.  Pertinent labs & imaging results that were available during my care of the patient were reviewed by me and considered in my medical decision making (see chart for details).    MDM Rules/Calculators/A&P                     Patient returning today with persistent fever now ongoing for the last 36 hours.  Patient was seen yesterday because she was also having abdominal pain and had negative imaging and normal labs and urine culture today showed mixed flora that appeared to be contaminant but no other acute findings.  Patient states the abdominal pain has gone away she just  continues to have intermittent fever.  Patient is well-appearing febrile to 100.5 here.  Patient is already had Covid 6 weeks ago and low suspicion that this would be recurrent Covid.  At this time if she tested positive it could be from her prior infection.  Patient has no internal hardware making her at high risk for epidural abscess, osteomyelitis or discitis.  She has no back pain no neck pain and no meningeal signs.  She has no prior cardiac issues and has not had any valve replacements.  She does not have a murmur on exam has no splinter hemorrhages or evidence concerning for endocarditis.  Patient is otherwise well-appearing at this time.  No evidence of cellulitis.  Will test patient for flu which is neg, chest x-ray without acute findings and blood cultures.  At this time otherwise we will send patient home to wait on blood cultures and supportive care.  She was instructed to return if symptoms worsen and follow-up with her doctors.  She is to see GI tomorrow.  Final Clinical Impression(s) / ED Diagnoses Final diagnoses:  Febrile illness    Rx / DC Orders ED Discharge Orders    None       Gwyneth Sprout, MD 07/06/19 2328

## 2019-07-06 NOTE — ED Triage Notes (Addendum)
Pt was seen here yesterday for abdominal pain and after examination she was diagnosed with abdominal pain. She returns today due to fever and chills.

## 2019-07-06 NOTE — Discharge Instructions (Signed)
Continue to drink plenty of fluids.  Continue to take Tylenol and ibuprofen as needed for the fever.  If you start having confusion or localized pain anywhere, uncontrollable vomiting, any shortness of breath or chest pain please return.  If your blood cultures do come back positive someone should contact you you can also see this information on MyChart.  Your influenza and chest x-ray were both normal today.

## 2019-07-06 NOTE — ED Notes (Signed)
Transported to xray 

## 2019-07-12 LAB — CULTURE, BLOOD (ROUTINE X 2)
Culture: NO GROWTH
Culture: NO GROWTH
Special Requests: ADEQUATE
Special Requests: ADEQUATE

## 2020-02-27 ENCOUNTER — Other Ambulatory Visit: Payer: Self-pay

## 2020-02-27 ENCOUNTER — Emergency Department (INDEPENDENT_AMBULATORY_CARE_PROVIDER_SITE_OTHER)
Admission: RE | Admit: 2020-02-27 | Discharge: 2020-02-27 | Disposition: A | Discharge status: Home / Self Care | Payer: BC Managed Care – PPO | Source: Ambulatory Visit

## 2020-02-27 VITALS — BP 179/104 | HR 76 | Temp 98.2°F

## 2020-02-27 DIAGNOSIS — L237 Allergic contact dermatitis due to plants, except food: Secondary | ICD-10-CM | POA: Diagnosis not present

## 2020-02-27 MED ORDER — PREDNISONE 10 MG (21) PO TBPK: ORAL_TABLET | Freq: Every day | ORAL | 0 refills | Status: DC

## 2020-02-27 NOTE — ED Provider Notes (Addendum)
Shelby Patel CARE    CSN: [id number] Arrival date & time: 02/27/20  0840      History   Chief Complaint Chief Complaint  Patient presents with  . Rash    HPI Shelby Patel is a 59 y.o. female.   This 59 year old woman is an established patient at [facility] urgent care.  She presents today for evaluation of rash.  Full evaluation of immunizations is not available in epic.  Patient c/o poison ivy on the right side of her body x 1 week, spreading, itching.  Patient has been applying Calimine lotion to areas.  She was exposed 1 week ago when she was working in the yard.  She is highly allergic.  The rash is to start in the last couple days and is affecting all extremities and underwear area.     Past Medical History:  Diagnosis Date  . Diverticulitis     There are no problems to display for this patient.   Past Surgical History:  Procedure Laterality Date  . EXTERNAL EAR SURGERY Left   . FOOT SURGERY Bilateral   . KNEE SURGERY    . KNEE SURGERY Right     OB History   No obstetric history on file.      Home Medications    Prior to Admission medications   Medication Sig Start Date End Date Taking? Authorizing Provider  predniSONE (STERAPRED UNI-PAK 21 TAB) 10 MG (21) TBPK tablet Take by mouth daily. Take 6 tabs by mouth daily  for 2 days, then 5 tabs for 2 days, then 4 tabs for 2 days, then 3 tabs for 2 days, 2 tabs for 2 days, then 1 tab by mouth daily for 2 days 02/27/20   Elvina Sidle, MD    Family History History reviewed. No pertinent family history.  Social History Social History   Tobacco Use  . Smoking status: Never Smoker  . Smokeless tobacco: Never Used  Substance Use Topics  . Alcohol use: Yes  . Drug use: No     Allergies   Penicillins   Review of Systems Review of Systems  Skin: Positive for rash.  All other systems reviewed and are negative.    Physical Exam Triage Vital Signs ED Triage Vitals  Enc Vitals Group      BP      Pulse      Resp      Temp      Temp src      SpO2      Weight      Height      Head Circumference      Peak Flow      Pain Score      Pain Loc      Pain Edu?      Excl. in GC?    No data found.  Updated Vital Signs BP (!) 179/104 (BP Location: Left Arm)   Pulse 76   Temp 98.2 F (36.8 C) (Oral)   SpO2 100%    Physical Exam Vitals and nursing note reviewed.  HENT:     Nose: Nose normal.     Mouth/Throat:     Pharynx: Oropharynx is clear.  Eyes:     Conjunctiva/sclera: Conjunctivae normal.  Cardiovascular:     Rate and Rhythm: Normal rate.     Heart sounds: Normal heart sounds.  Musculoskeletal:        General: Normal range of motion.     Cervical back: Normal range of motion  and neck supple.  Skin:    General: Skin is warm and dry.     Comments: Multiple scattered papules, some in streaks, worst in the gluteal area  Neurological:     General: No focal deficit present.     Mental Status: She is alert and oriented to person, place, and time.  Psychiatric:        Behavior: Behavior normal.        Thought Content: Thought content normal.      UC Treatments / Results  Labs (all labs ordered are listed, but only abnormal results are displayed) Labs Reviewed - No data to display  EKG    Radiology No results found.  Procedures Procedures (including critical care time)  Medications Ordered in UC Medications - No data to display  Initial Impression / Assessment and Plan / UC Course  I have reviewed the triage vital signs and the nursing notes.  Pertinent labs & imaging results that were available during my care of the patient were reviewed by me and considered in my medical decision making (see chart for details).    Final Clinical Impressions(s) / UC Diagnoses   Final diagnoses:  Poison ivy   Discharge Instructions   None    ED Prescriptions    Medication Sig Dispense Auth. Provider   predniSONE (STERAPRED UNI-PAK 21 TAB) 10 MG  (21) TBPK tablet Take by mouth daily. Take 6 tabs by mouth daily  for 2 days, then 5 tabs for 2 days, then 4 tabs for 2 days, then 3 tabs for 2 days, 2 tabs for 2 days, then 1 tab by mouth daily for 2 days 42 tablet Elvina Sidle, MD     I have reviewed the PDMP during this encounter.   Elvina Sidle, MD 02/27/20 0900    Elvina Sidle, MD 02/27/20 [phone]

## 2020-02-27 NOTE — ED Triage Notes (Signed)
Patient c/o poison ivy on the right side of her body x 1 week, spreading, itching.  Patient has been applying Calimine lotion to areas.

## 2020-12-31 ENCOUNTER — Other Ambulatory Visit: Payer: Self-pay

## 2020-12-31 ENCOUNTER — Emergency Department (INDEPENDENT_AMBULATORY_CARE_PROVIDER_SITE_OTHER)
Admission: EM | Admit: 2020-12-31 | Discharge: 2020-12-31 | Disposition: A | Discharge status: Home / Self Care | Payer: BC Managed Care – PPO | Source: Home / Self Care | Attending: Emergency Medicine | Admitting: Emergency Medicine

## 2020-12-31 ENCOUNTER — Emergency Department (INDEPENDENT_AMBULATORY_CARE_PROVIDER_SITE_OTHER): Payer: BC Managed Care – PPO

## 2020-12-31 ENCOUNTER — Encounter: Payer: Self-pay | Admitting: Emergency Medicine

## 2020-12-31 DIAGNOSIS — S6992XA Unspecified injury of left wrist, hand and finger(s), initial encounter: Secondary | ICD-10-CM

## 2020-12-31 DIAGNOSIS — S63502A Unspecified sprain of left wrist, initial encounter: Secondary | ICD-10-CM | POA: Diagnosis not present

## 2020-12-31 DIAGNOSIS — S80811A Abrasion, right lower leg, initial encounter: Secondary | ICD-10-CM | POA: Diagnosis not present

## 2020-12-31 MED ORDER — ACETAMINOPHEN 325 MG PO TABS
650.0000 mg | ORAL_TABLET | Freq: Once | ORAL | Status: AC
  Administered 2020-12-31: 650 mg via ORAL

## 2020-12-31 MED ORDER — IBUPROFEN 600 MG PO TABS: 600.0000 mg | ORAL_TABLET | Freq: Four times a day (QID) | ORAL | 0 refills | Status: DC | PRN
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
## 2020-12-31 NOTE — Discharge Instructions (Addendum)
Ice, elevate, 600 mg of ibuprofen combined with 1000 mg of Tylenol 3-4 times a day as needed for pain.  Wrist splint/brace if it continues to bother you.  Follow-up with Dr. Susa Simmonds if you are still having wrist pain in 2 weeks.  Sometimes fractures do not show up on initial x-ray.  Clean lacerations/abrasions with soap and water.  Bacitracin sparingly.
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
## 2021-09-13 DIAGNOSIS — L570 Actinic keratosis: Secondary | ICD-10-CM | POA: Diagnosis not present

## 2021-09-13 DIAGNOSIS — D225 Melanocytic nevi of trunk: Secondary | ICD-10-CM | POA: Diagnosis not present

## 2021-09-13 DIAGNOSIS — Z85828 Personal history of other malignant neoplasm of skin: Secondary | ICD-10-CM | POA: Diagnosis not present

## 2021-09-13 DIAGNOSIS — L821 Other seborrheic keratosis: Secondary | ICD-10-CM | POA: Diagnosis not present

## 2021-09-13 DIAGNOSIS — D2372 Other benign neoplasm of skin of left lower limb, including hip: Secondary | ICD-10-CM | POA: Diagnosis not present

## 2021-11-27 DIAGNOSIS — Z23 Encounter for immunization: Secondary | ICD-10-CM | POA: Diagnosis not present

## 2022-03-04 DIAGNOSIS — Z23 Encounter for immunization: Secondary | ICD-10-CM | POA: Diagnosis not present
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
## 2022-06-21 DIAGNOSIS — E559 Vitamin D deficiency, unspecified: Secondary | ICD-10-CM | POA: Diagnosis not present

## 2022-06-21 DIAGNOSIS — Z0000 Encounter for general adult medical examination without abnormal findings: Secondary | ICD-10-CM | POA: Diagnosis not present

## 2022-06-27 DIAGNOSIS — Z1151 Encounter for screening for human papillomavirus (HPV): Secondary | ICD-10-CM | POA: Diagnosis not present

## 2022-06-27 DIAGNOSIS — R001 Bradycardia, unspecified: Secondary | ICD-10-CM | POA: Diagnosis not present

## 2022-06-27 DIAGNOSIS — Z01419 Encounter for gynecological examination (general) (routine) without abnormal findings: Secondary | ICD-10-CM | POA: Diagnosis not present

## 2022-06-27 DIAGNOSIS — E559 Vitamin D deficiency, unspecified: Secondary | ICD-10-CM | POA: Diagnosis not present

## 2022-06-27 DIAGNOSIS — M8588 Other specified disorders of bone density and structure, other site: Secondary | ICD-10-CM | POA: Diagnosis not present

## 2022-06-27 DIAGNOSIS — Z0000 Encounter for general adult medical examination without abnormal findings: Secondary | ICD-10-CM | POA: Diagnosis not present

## 2022-06-27 LAB — HM PAP SMEAR: HM Pap smear: NEGATIVE

## 2022-07-01 DIAGNOSIS — Z1231 Encounter for screening mammogram for malignant neoplasm of breast: Secondary | ICD-10-CM | POA: Diagnosis not present

## 2022-07-01 DIAGNOSIS — M8580 Other specified disorders of bone density and structure, unspecified site: Secondary | ICD-10-CM | POA: Diagnosis not present

## 2022-07-01 DIAGNOSIS — Z780 Asymptomatic menopausal state: Secondary | ICD-10-CM | POA: Diagnosis not present

## 2022-07-01 DIAGNOSIS — M8589 Other specified disorders of bone density and structure, multiple sites: Secondary | ICD-10-CM | POA: Diagnosis not present

## 2022-07-21 DIAGNOSIS — N3001 Acute cystitis with hematuria: Secondary | ICD-10-CM | POA: Diagnosis not present

## 2022-07-21 DIAGNOSIS — R300 Dysuria: Secondary | ICD-10-CM | POA: Diagnosis not present

## 2022-09-17 DIAGNOSIS — W908XXS Exposure to other nonionizing radiation, sequela: Secondary | ICD-10-CM | POA: Diagnosis not present

## 2022-09-17 DIAGNOSIS — L578 Other skin changes due to chronic exposure to nonionizing radiation: Secondary | ICD-10-CM | POA: Diagnosis not present

## 2022-09-17 DIAGNOSIS — L821 Other seborrheic keratosis: Secondary | ICD-10-CM | POA: Diagnosis not present

## 2022-09-17 DIAGNOSIS — D2372 Other benign neoplasm of skin of left lower limb, including hip: Secondary | ICD-10-CM | POA: Diagnosis not present

## 2022-09-23 DIAGNOSIS — R300 Dysuria: Secondary | ICD-10-CM | POA: Diagnosis not present

## 2022-09-23 DIAGNOSIS — N390 Urinary tract infection, site not specified: Secondary | ICD-10-CM | POA: Diagnosis not present

## 2022-09-23 DIAGNOSIS — R030 Elevated blood-pressure reading, without diagnosis of hypertension: Secondary | ICD-10-CM | POA: Diagnosis not present

## 2022-09-23 DIAGNOSIS — R319 Hematuria, unspecified: Secondary | ICD-10-CM | POA: Diagnosis not present

## 2023-07-22 ENCOUNTER — Encounter: Payer: Self-pay | Admitting: Family Medicine

## 2023-07-22 ENCOUNTER — Ambulatory Visit (INDEPENDENT_AMBULATORY_CARE_PROVIDER_SITE_OTHER): Payer: BC Managed Care – PPO | Admitting: Family Medicine

## 2023-07-22 VITALS — BP 133/84 | HR 72 | Ht 68.0 in | Wt 122.0 lb

## 2023-07-22 DIAGNOSIS — M8580 Other specified disorders of bone density and structure, unspecified site: Secondary | ICD-10-CM | POA: Diagnosis not present

## 2023-07-22 DIAGNOSIS — Z1231 Encounter for screening mammogram for malignant neoplasm of breast: Secondary | ICD-10-CM | POA: Diagnosis not present

## 2023-07-22 DIAGNOSIS — Z0000 Encounter for general adult medical examination without abnormal findings: Secondary | ICD-10-CM | POA: Diagnosis not present

## 2023-07-22 DIAGNOSIS — H9312 Tinnitus, left ear: Secondary | ICD-10-CM | POA: Insufficient documentation

## 2023-07-22 NOTE — Assessment & Plan Note (Signed)
Am having pt keep track of her blood pressure to see if her bp is related to tinnitus however with normal bp here in clinic I believe this is unlikely and more of an inner ear issues.  - have sent referral to ENT

## 2023-07-22 NOTE — Assessment & Plan Note (Signed)
Blood worked obtained

## 2023-07-22 NOTE — Progress Notes (Signed)
Established patient visit   Patient: Shelby Patel   DOB: 07/04/1960   63 y.o. Female  MRN: [id number] Visit Date: 07/22/2023  Today's healthcare provider: Charlton Amor, DO   Chief Complaint  Patient presents with   New Patient (Initial Visit)    Est. Care    SUBJECTIVE    Chief Complaint  Patient presents with   New Patient (Initial Visit)    Est. Care   HPI HPI     New Patient (Initial Visit)    Additional comments: Est. Care      Last edited by Roselyn Reef, CMA on 07/22/2023  3:36 PM.       Pt presents to establish care. Has pmh of osteopenia seen on DEXA scan. Last dexa was a few years ago.    Had mammogram done last January and needs repeat  Colonscopy done in 2022 with 5 year follow up   Pt notes some "heartbeat sound" in her ear. She has been monitoring her bp for the past three days. Pt admits to a history of ear problems in her left ear which is the ear she hears the heartbeat.  Review of Systems  Constitutional:  Negative for activity change, fatigue and fever.  Respiratory:  Negative for cough and shortness of breath.   Cardiovascular:  Negative for chest pain.  Gastrointestinal:  Negative for abdominal pain.  Genitourinary:  Negative for difficulty urinating.       No outpatient medications have been marked as taking for the 07/22/23 encounter (Office Visit) with Charlton Amor, DO.    OBJECTIVE    BP 133/84 (BP Location: Left Arm, Patient Position: Sitting, Cuff Size: Normal)   Pulse 72   Ht 5\' 8"  (1.727 m)   Wt 122 lb (55.3 kg)   SpO2 99%   BMI 18.55 kg/m   Physical Exam Vitals and nursing note reviewed.  Constitutional:      General: She is not in acute distress.    Appearance: Normal appearance.  HENT:     Head: Normocephalic and atraumatic.     Right Ear: Tympanic membrane, ear canal and external ear normal.     Left Ear: Tympanic membrane, ear canal and external ear normal.     Nose: Nose normal.  Eyes:      Conjunctiva/sclera: Conjunctivae normal.  Cardiovascular:     Rate and Rhythm: Normal rate and regular rhythm.  Pulmonary:     Effort: Pulmonary effort is normal.     Breath sounds: Normal breath sounds.  Neurological:     General: No focal deficit present.     Mental Status: She is alert and oriented to person, place, and time.  Psychiatric:        Mood and Affect: Mood normal.        Behavior: Behavior normal.        Thought Content: Thought content normal.        Judgment: Judgment normal.        ASSESSMENT & PLAN    Problem List Items Addressed This Visit       Musculoskeletal and Integument   Osteopenia   Will go ahead and re-order dexa      Relevant Orders   Vitamin D (25 hydroxy)   DG Bone Density     Other   Routine adult health maintenance - Primary   Blood worked obtained      Relevant Orders   Lipid panel   CMP14+EGFR   CBC  Tinnitus of left ear   Am having pt keep track of her blood pressure to see if her bp is related to tinnitus however with normal bp here in clinic I believe this is unlikely and more of an inner ear issues.  - have sent referral to ENT      Relevant Orders   Ambulatory referral to ENT   Other Visit Diagnoses       Encounter for screening mammogram for malignant neoplasm of breast       Relevant Orders   MM DIGITAL SCREENING BILATERAL       No follow-ups on file.      No orders of the defined types were placed in this encounter.   Orders Placed This Encounter  Procedures   MM DIGITAL SCREENING BILATERAL    Standing Status:   Future    Expiration Date:   07/21/2024    Preferred imaging location?:   [facility]    Reason for exam::   screening    Release to patient:   Immediate   DG Bone Density    Standing Status:   Future    Expiration Date:   07/21/2024    Reason for Exam (SYMPTOM  OR DIAGNOSIS REQUIRED):   Eval bone density    Preferred imaging location?:   [facility]   Lipid panel     Has the patient fasted?:   No    Release to patient:   Immediate   CMP14+EGFR    Has the patient fasted?:   No   CBC   Vitamin D (25 hydroxy)   Ambulatory referral to ENT    Referral Priority:   Routine    Referral Type:   Consultation    Referral Reason:   Specialty Services Required    Requested Specialty:   Otolaryngology    Number of Visits Requested:   1     Charlton Amor, DO  [facility] at [facility] [phone] (phone) [phone] (fax)  [facility]

## 2023-07-22 NOTE — Assessment & Plan Note (Signed)
Will go ahead and re-order dexa

## 2023-07-29 DIAGNOSIS — M8580 Other specified disorders of bone density and structure, unspecified site: Secondary | ICD-10-CM | POA: Diagnosis not present

## 2023-07-29 DIAGNOSIS — Z0000 Encounter for general adult medical examination without abnormal findings: Secondary | ICD-10-CM | POA: Diagnosis not present

## 2023-07-30 LAB — CMP14+EGFR
ALT: 19 [IU]/L (ref 0–32)
AST: 22 [IU]/L (ref 0–40)
Albumin: 4.6 g/dL (ref 3.9–4.9)
Alkaline Phosphatase: 77 [IU]/L (ref 44–121)
BUN/Creatinine Ratio: 21 (ref 12–28)
BUN: 15 mg/dL (ref 8–27)
Bilirubin Total: 0.4 mg/dL (ref 0.0–1.2)
CO2: 24 mmol/L (ref 20–29)
Calcium: 9.9 mg/dL (ref 8.7–10.3)
Chloride: 99 mmol/L (ref 96–106)
Creatinine, Ser: 0.73 mg/dL (ref 0.57–1.00)
Globulin, Total: 2.5 g/dL (ref 1.5–4.5)
Glucose: 88 mg/dL (ref 70–99)
Potassium: 4.3 mmol/L (ref 3.5–5.2)
Sodium: 139 mmol/L (ref 134–144)
Total Protein: 7.1 g/dL (ref 6.0–8.5)
eGFR: 93 mL/min/{1.73_m2} (ref >59)

## 2023-07-30 LAB — VITAMIN D 25 HYDROXY (VIT D DEFICIENCY, FRACTURES): Vit D, 25-Hydroxy: 38.2 ng/mL (ref 30.0–100.0)

## 2023-07-30 LAB — CBC
Hematocrit: 37.8 % (ref 34.0–46.6)
Hemoglobin: 12.7 g/dL (ref 11.1–15.9)
MCH: 33.1 pg — ABNORMAL HIGH (ref 26.6–33.0)
MCHC: 33.6 g/dL (ref 31.5–35.7)
MCV: 98 fL — ABNORMAL HIGH (ref 79–97)
Platelets: 296 10*3/uL (ref 150–450)
RBC: 3.84 x10E6/uL (ref 3.77–5.28)
RDW: 11.8 % (ref 11.7–15.4)
WBC: 3.8 10*3/uL (ref 3.4–10.8)

## 2023-07-30 LAB — LIPID PANEL
Chol/HDL Ratio: 2.4 {ratio} (ref 0.0–4.4)
Cholesterol, Total: 267 mg/dL — ABNORMAL HIGH (ref 100–199)
HDL: 111 mg/dL (ref >39)
LDL Chol Calc (NIH): 147 mg/dL — ABNORMAL HIGH (ref 0–99)
Triglycerides: 58 mg/dL (ref 0–149)
VLDL Cholesterol Cal: 9 mg/dL (ref 5–40)

## 2023-07-31 ENCOUNTER — Encounter: Payer: Self-pay | Admitting: Family Medicine

## 2023-07-31 NOTE — Progress Notes (Signed)
Hi Quianna, total cholesterol and LDL are elevated just encourage you to continue to work on healthy diet and regular exercise.  You have great HDL which is the good cholesterol.  No anemia on your blood count.  The MCV is trending back to normal which is great.  Kidney and liver function are stable.  Vitamin D looks good.  Though it is on the low end of normal so just encourage you to make sure you are taking 25 mcg of vitamin D daily at least through the fall winter and early spring months of the year.

## 2023-08-15 ENCOUNTER — Encounter (INDEPENDENT_AMBULATORY_CARE_PROVIDER_SITE_OTHER): Payer: Self-pay

## 2023-08-15 ENCOUNTER — Ambulatory Visit (INDEPENDENT_AMBULATORY_CARE_PROVIDER_SITE_OTHER): Payer: BC Managed Care – PPO | Admitting: Audiology

## 2023-08-15 ENCOUNTER — Ambulatory Visit (INDEPENDENT_AMBULATORY_CARE_PROVIDER_SITE_OTHER): Payer: BC Managed Care – PPO | Admitting: Physician Assistant

## 2023-08-15 VITALS — BP 170/93 | HR 64 | Ht 68.0 in | Wt 121.0 lb

## 2023-08-15 DIAGNOSIS — H9041 Sensorineural hearing loss, unilateral, right ear, with unrestricted hearing on the contralateral side: Secondary | ICD-10-CM | POA: Diagnosis not present

## 2023-08-15 DIAGNOSIS — H93A2 Pulsatile tinnitus, left ear: Secondary | ICD-10-CM | POA: Diagnosis not present

## 2023-08-15 DIAGNOSIS — H90A32 Mixed conductive and sensorineural hearing loss, unilateral, left ear with restricted hearing on the contralateral side: Secondary | ICD-10-CM

## 2023-08-15 DIAGNOSIS — H9012 Conductive hearing loss, unilateral, left ear, with unrestricted hearing on the contralateral side: Secondary | ICD-10-CM | POA: Diagnosis not present

## 2023-08-15 NOTE — Progress Notes (Unsigned)
 Dear Dr. Tamera Punt, Here is my assessment for our mutual patient, Shelby Patel. Thank you for allowing me the opportunity to care for your patient. Please do not hesitate to contact me should you have any other questions. Sincerely, Burna Forts PA-C  Otolaryngology Clinic Note Referring provider: Dr. Tamera Punt HPI:  Shelby Patel is a 63 y.o. female kindly referred by Dr. Tamera Punt   The patient is a 63 year old female presenting today with complaints of pulsatile tinnitus.  The patient notes that over the last 2 months she has had a heartbeat sound in her left ear.  She notes that she had a similar sensation when she was a child when she had fluid in her ears but has not had this as an adult.  She knows it comes and goes approximately 4-5 times per day but does not stay throughout the day, there are rare periods when she does not experience the sensation.  She denies any pain, neurologic deficits, changes in hearing.  No associated ringing or dizziness.  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  She also notes that Dr. Guadalupe Maple short of ENT here in [place] did ear surgery on her, she is uncertain exactly what it was but notes that she had a prosthesis placed into her left ear.  No exposure to smoke.  No seasonal allergies.    Independent Review of Additional Tests or Records:  Audiology evaluation on 09/02/2023 shows tympanometry right ear type AD, left ear type C, pure-tone audiometry shows right ear normal to moderately severe sensorineural hearing loss from 250 Hz through 8000 Hz, left ear borderline normal to moderately severe mixed hearing loss from 250 through 8000 Hz.  Right SRT obtained at 10 dB HL, left SRT obtained at 25 dBHL, word recognition score right 96% at 70 dBHL, left 100% at 70 dBHL  Bilateral mixed hearing loss with asymmetry noted   PMH/Meds/All/SocHx/FamHx/ROS:   Past  Medical History:  Diagnosis Date   Diverticulitis      Past Surgical History:  Procedure Laterality Date   EXTERNAL EAR SURGERY Left    FOOT SURGERY Bilateral    KNEE SURGERY     KNEE SURGERY Right     Family History  Problem Relation Age of Onset   Cancer Mother    Heart disease Mother    Hyperlipidemia Mother    Hypertension Mother    Varicose Veins Mother    Cancer Father    Hyperlipidemia Father    Hypertension Father    Heart disease Maternal Grandfather    Hypertension Brother    Kidney disease Brother      Social Connections: Socially Integrated (07/15/2023)   Social Connection and Isolation Panel [NHANES]    Frequency of Communication with Friends and Family: More than three times a week    Frequency of Social Gatherings with Friends and Family: Once a week    Attends Religious Services: More than 4 times per year    Active Member of [employer] or Organizations: Yes    Attends Banker Meetings: 1 to 4 times per year    Marital Status: Married      Current Outpatient Medications:    Cholecalciferol 50 MCG (2000 UT) CAPS, Take by mouth., Disp: , Rfl:    b complex vitamins capsule, Take 1 capsule by mouth daily., Disp: , Rfl:    Physical Exam:  BP (!) 170/93 (BP Location: Right Arm, Patient Position: Sitting)   Pulse 64   Ht 5\' 8"  (1.727 m)   Wt 121 lb (54.9 kg)   SpO2 97%   BMI 18.40 kg/m   Pertinent Findings  CN II-XII intact *** Bilateral EAC clear and TM intact with well pneumatized middle ear spaces Weber 512: equal Rinne 512: AC > BC b/l  Anterior rhinoscopy: Septum ***; bilateral inferior turbinates with *** No lesions of oral cavity/oropharynx; dentition *** No obviously palpable neck masses/lymphadenopathy/thyromegaly No respiratory distress or stridor  Left nasal deviation Left scar tissuew some fluid, right tube scar   Seprately Identifiable Procedures:  None  Impression & Plans:  Shelby Patel is a 63 y.o. female with the  following   ***   - f/u ***   Thank you for allowing me the opportunity to care for your patient. Please do not hesitate to contact me should you have any other questions.  Sincerely, Burna Forts PA-C [facility] Phone: [phone] Fax: [phone]  08/15/2023, 9:53 AM

## 2023-08-15 NOTE — Progress Notes (Signed)
  [address], [address] [place], [place] [address] [phone]  Audiological Evaluation    Name: Shelby Patel     DOB:   [birth year]      MRN:   [id number]                                                                                     Service Date: 08/15/2023     Accompanied by: unaccompanied    Patient comes today after Eyvonne Mechanic, PA-C sent a referral for a hearing evaluation due to concerns with tinnitus.   Symptoms Yes Details  Hearing loss  [x]  She remembers that many years ago an ENT said to her she had a 20% loss in the left ear.   Tinnitus  [x]  Comes and goes, hears her hear beat in her left ear  Ear pain/ infections/pressure  []    Balance problems  []    Noise exposure history  []    Previous ear surgeries  [x]  Reports had tubes in both ears when young, then when in high school had a left eardrum patch, later she had surgery because she did not have "one of hearing bones".  Family history of hearing loss  []    Amplification  []    Other  []      Otoscopy: Right ear: Clear external ear canals and notable landmarks visualized on the tympanic membrane. Left ear:  Clear external ear canals and notable landmarks visualized on the tympanic membrane.  Tympanometry: Right ear: Type Ad- Normal external ear canal volume with normal middle ear pressure and high tympanic membrane compliance. Left ear: Type C- Normal external ear canal volume with negative middle ear pressure and normal tympanic membrane compliance. (Pressure peak at -150 daPa)    Pure tone Audiometry: Right ear- Normal to moderately severe sensorineural hearing loss from 250 Hz - 8000 Hz. Left ear-  Borderline normal to moderately severe mixed hearing loss from 250 Hz - 8000 Hz.  Speech Audiometry: Right ear- Speech Reception Threshold (SRT) was obtained at 10 dBHL. Left ear-Speech Reception Threshold (SRT) was obtained at 25 dBHL.   Word Recognition Score Tested using NU-6 (MLV) Right ear: 96% was  obtained at a presentation level of 70 dBHL with contralateral masking which is deemed as  excellent. Left ear: 100% was obtained at a presentation level of 70 dBHL with contralateral masking which is deemed as  excellent.   The hearing test results were completed under headphones and re-checked with inserts and results are deemed to be of good reliability. Test technique:  conventional    Impression:  There is a significant difference in pure-tone thresholds between ears, worse in the left ear.  Recommendations: Follow up with ENT as scheduled for today. Return for a hearing evaluation if concerns with hearing changes arise or per MD recommendation. Use hearing protection when exposed to loud/damaging sounds.  Consider various tinnitus strategies, including the use of a sound generator, hearing aids, and/or tinnitus retraining therapy.  Consider a communication needs assessment after medical clearance for hearing aids is obtained.   Kesean Serviss MARIE LEROUX-MARTINEZ, AUD

## 2023-08-19 ENCOUNTER — Telehealth: Payer: Self-pay

## 2023-08-19 NOTE — Patient Instructions (Signed)
 I have ordered an imaging study for you to complete prior to your next visit. Please call [facility] Scheduling at [phone] to schedule your imaging if you have not received a call within 24 hours. If you are unable to complete your imaging study prior to your next scheduled visit please call our office to let us know.

## 2023-08-19 NOTE — Telephone Encounter (Signed)
 Returned patients call.  She had already spoke with Burna Forts, PA-C

## 2023-08-25 ENCOUNTER — Ambulatory Visit (HOSPITAL_BASED_OUTPATIENT_CLINIC_OR_DEPARTMENT_OTHER)
Admission: RE | Admit: 2023-08-25 | Discharge: 2023-08-25 | Disposition: A | Discharge status: Home / Self Care | Source: Ambulatory Visit | Attending: Physician Assistant | Admitting: Physician Assistant

## 2023-08-25 ENCOUNTER — Encounter (HOSPITAL_BASED_OUTPATIENT_CLINIC_OR_DEPARTMENT_OTHER): Payer: Self-pay

## 2023-08-25 DIAGNOSIS — H93A2 Pulsatile tinnitus, left ear: Secondary | ICD-10-CM | POA: Insufficient documentation

## 2023-08-25 DIAGNOSIS — I6523 Occlusion and stenosis of bilateral carotid arteries: Secondary | ICD-10-CM | POA: Diagnosis not present

## 2023-08-25 DIAGNOSIS — H9319 Tinnitus, unspecified ear: Secondary | ICD-10-CM | POA: Diagnosis not present

## 2023-08-25 MED ORDER — IOHEXOL 350 MG/ML SOLN
100.0000 mL | Freq: Once | INTRAVENOUS | Status: AC | PRN
  Administered 2023-08-25: 75 mL via INTRAVENOUS

## 2023-08-27 ENCOUNTER — Ambulatory Visit (HOSPITAL_BASED_OUTPATIENT_CLINIC_OR_DEPARTMENT_OTHER): Admission: RE | Admit: 2023-08-27 | Source: Ambulatory Visit

## 2023-08-31 ENCOUNTER — Ambulatory Visit (HOSPITAL_BASED_OUTPATIENT_CLINIC_OR_DEPARTMENT_OTHER)

## 2023-09-10 ENCOUNTER — Ambulatory Visit (HOSPITAL_BASED_OUTPATIENT_CLINIC_OR_DEPARTMENT_OTHER)
Admission: RE | Admit: 2023-09-10 | Discharge: 2023-09-10 | Disposition: A | Discharge status: Home / Self Care | Source: Ambulatory Visit | Attending: Physician Assistant | Admitting: Physician Assistant

## 2023-09-10 DIAGNOSIS — H93A2 Pulsatile tinnitus, left ear: Secondary | ICD-10-CM | POA: Diagnosis not present

## 2023-09-10 MED ORDER — GADOBUTROL 1 MMOL/ML IV SOLN
6.0000 mL | Freq: Once | INTRAVENOUS | Status: AC | PRN
  Administered 2023-09-10: 6 mL via INTRAVENOUS

## 2023-09-15 ENCOUNTER — Other Ambulatory Visit: Payer: Self-pay | Admitting: Family Medicine

## 2023-09-15 DIAGNOSIS — Z1231 Encounter for screening mammogram for malignant neoplasm of breast: Secondary | ICD-10-CM

## 2023-09-18 DIAGNOSIS — D225 Melanocytic nevi of trunk: Secondary | ICD-10-CM | POA: Diagnosis not present

## 2023-09-18 DIAGNOSIS — L570 Actinic keratosis: Secondary | ICD-10-CM | POA: Diagnosis not present

## 2023-09-18 DIAGNOSIS — L821 Other seborrheic keratosis: Secondary | ICD-10-CM | POA: Diagnosis not present

## 2023-09-18 DIAGNOSIS — Z85828 Personal history of other malignant neoplasm of skin: Secondary | ICD-10-CM | POA: Diagnosis not present

## 2023-09-18 DIAGNOSIS — B353 Tinea pedis: Secondary | ICD-10-CM | POA: Diagnosis not present

## 2023-09-24 ENCOUNTER — Ambulatory Visit: Payer: BC Managed Care – PPO

## 2023-09-24 DIAGNOSIS — Z1231 Encounter for screening mammogram for malignant neoplasm of breast: Secondary | ICD-10-CM | POA: Diagnosis not present

## 2023-10-02 ENCOUNTER — Ambulatory Visit (INDEPENDENT_AMBULATORY_CARE_PROVIDER_SITE_OTHER)

## 2023-10-06 ENCOUNTER — Encounter (INDEPENDENT_AMBULATORY_CARE_PROVIDER_SITE_OTHER): Payer: Self-pay

## 2023-10-06 ENCOUNTER — Ambulatory Visit (INDEPENDENT_AMBULATORY_CARE_PROVIDER_SITE_OTHER)

## 2023-10-06 VITALS — BP 137/79 | HR 72 | Ht 68.0 in | Wt 122.0 lb

## 2023-10-06 DIAGNOSIS — H93A9 Pulsatile tinnitus, unspecified ear: Secondary | ICD-10-CM

## 2023-10-06 DIAGNOSIS — H93A2 Pulsatile tinnitus, left ear: Secondary | ICD-10-CM

## 2023-10-06 NOTE — Progress Notes (Unsigned)
 Dear Dr. Dianah Fort, Here is my assessment for our mutual patient, Shelby Patel. Thank you for allowing me the opportunity to care for your patient. Please do not hesitate to contact me should you have any other questions. Sincerely, Belma Boxer PA-C  Otolaryngology Clinic Note Referring provider: Dr. Dianah Fort HPI:  Shelby Patel is a 63 y.o. female kindly referred by Dr. Dianah Fort   The patient is here for follow up evaluation for pulsatile tinnitus and hearing loss. She was last seen in the office on 08/15/2023.Below is a recap of that encounter.   The patient is a 63 year old female presenting today with complaints of pulsatile tinnitus.  The patient notes that over the last 2 months she has had a heartbeat sound in her left ear.  She notes that she had a similar sensation when she was a child when she had fluid in her ears but has not had this as an adult.  She knows it comes and goes approximately 4-5 times per day but does not stay throughout the day, there are rare periods when she does not experience the sensation.  She denies any pain, neurologic deficits, changes in hearing.  No associated ringing or dizziness.   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  She also notes that Dr. Latina Pol? of ENT here in [place] did ear surgery on her, she is uncertain exactly what it was but notes that she had a prosthesis placed into her left ear.  No exposure to smoke.  No seasonal allergies.    Update 10/06/2023  Since her last office visit she denies any changes to hearing or tinnitus. Continues to deny any neuro related symptoms. She is overall doing well. She did complete the MRI IAC and Ct angio head and neck.       Independent Review of Additional Tests or Records:  Ct angio head and neck on 08/25/2023 IMPRESSION: 1. No acute intracranial abnormality. Mild chronic small-vessel disease. 2.  No large vessel occlusion, hemodynamically significant stenosis, or aneurysm in the head or neck. 3. Mild ectasia of the basilar tip without discrete aneurysm. 4. Multiple ovoid, solid lung nodules in the left-greater-than-right upper lobes, measuring up to 5 mm in the anterior left upper lobe segment. Per Fleischner Society Guidelines, if patient is low risk for malignancy, no routine follow-up imaging is recommended. If patient is high risk for malignancy, a non-contrast Chest CT at 12 months is optional. If performed and the nodule is stable at 12 months, no further follow-up is recommended. These guidelines do not apply to immunocompromised patients and patients with cancer. Follow up in patients with significant comorbidities as clinically warranted. Reference: Radiology. 2017; 284(1):228-43.   MRI IAC 24/04/2024    IMPRESSION: Normal MRI of the brain and internal auditory canals   PMH/Meds/All/SocHx/FamHx/ROS:   Past Medical History:  Diagnosis Date   Diverticulitis      Past Surgical History:  Procedure Laterality Date   EXTERNAL EAR SURGERY Left    FOOT SURGERY Bilateral    KNEE SURGERY     KNEE SURGERY Right     Family History  Problem Relation Age of Onset   Cancer Mother    Heart disease Mother    Hyperlipidemia Mother    Hypertension Mother    Varicose Veins Mother    Cancer Father    Hyperlipidemia Father    Hypertension Father  Heart disease Maternal Grandfather    Hypertension Brother    Kidney disease Brother      Social Connections: Socially Integrated (07/15/2023)   Social Connection and Isolation Panel [NHANES]    Frequency of Communication with Friends and Family: More than three times a week    Frequency of Social Gatherings with Friends and Family: Once a week    Attends Religious Services: More than 4 times per year    Active Member of [employer] or Organizations: Yes    Attends Banker Meetings: 1 to 4 times per year     Marital Status: Married      Current Outpatient Medications:    b complex vitamins capsule, Take 1 capsule by mouth daily., Disp: , Rfl:    Cholecalciferol 50 MCG (2000 UT) CAPS, Take by mouth., Disp: , Rfl:    Physical Exam:   BP 137/79 (BP Location: Right Arm, Patient Position: Sitting, Cuff Size: Normal)   Pulse 72   Ht 5\' 8"  (1.727 m)   Wt 122 lb (55.3 kg)   SpO2 95%   BMI 18.55 kg/m    Pertinent Findings  CN II-XII intact Bilateral EAC clear and TM intact with well pneumatized middle ear spaces, left TM with scarring throughout, right TM with scarring left lower quadrant  Anterior rhinoscopy: Septum left deviation; bilateral inferior turbinates with minimal hypertrophy  No lesions of oral cavity/oropharynx; dentition wnl No obviously palpable neck masses/lymphadenopathy/thyromegaly No respiratory distress or stridor  Seprately Identifiable Procedures:  None  Impression & Plans:  Shelby Patel is a 63 y.o. female with the following   Pulsatile tinnitus- MRI/ CT images reviewed with no identifiable cause for tinnitus. No red flags. Pt will continue to monitor for any cahnges and follow up as needed.  Hearing loss-  She is at her baseline today. 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 Continue to monitor for any changes and follow up as needed.      - f/u PRN   Thank you for allowing me the opportunity to care for your patient. Please do not hesitate to contact me should you have any other questions.  Sincerely, Belma Boxer PA-C [facility] Phone: [phone] Fax: [phone]  10/06/2023, 3:18 PM

## 2023-10-16 ENCOUNTER — Encounter: Payer: Self-pay | Admitting: Family Medicine

## 2023-12-21 ENCOUNTER — Ambulatory Visit
Admission: RE | Admit: 2023-12-21 | Discharge: 2023-12-21 | Disposition: A | Discharge status: Home / Self Care | Source: Ambulatory Visit | Attending: Family Medicine | Admitting: Family Medicine

## 2023-12-21 ENCOUNTER — Other Ambulatory Visit: Payer: Self-pay

## 2023-12-21 VITALS — BP 160/91 | HR 71 | Temp 98.3°F | Resp 19

## 2023-12-21 DIAGNOSIS — S39012A Strain of muscle, fascia and tendon of lower back, initial encounter: Secondary | ICD-10-CM

## 2023-12-21 DIAGNOSIS — M62830 Muscle spasm of back: Secondary | ICD-10-CM

## 2023-12-21 MED ORDER — METHOCARBAMOL 500 MG PO TABS: 500.0000 mg | ORAL_TABLET | Freq: Two times a day (BID) | ORAL | 0 refills | Status: DC

## 2023-12-21 MED ORDER — PREDNISONE 50 MG PO TABS: ORAL_TABLET | ORAL | 0 refills | Status: DC

## 2023-12-21 MED ORDER — METHYLPREDNISOLONE ACETATE 80 MG/ML IJ SUSP
80.0000 mg | Freq: Once | INTRAMUSCULAR | Status: AC
  Administered 2023-12-21: 80 mg via INTRAMUSCULAR

## 2023-12-21 NOTE — ED Triage Notes (Signed)
 Pt presents to [facility] with low back pain since Friday morning. Pt has taken tylenol  and motrin  with minimal improvement.

## 2023-12-21 NOTE — ED Provider Notes (Signed)
 [name]    CSN: [id number] Arrival date & time: 12/21/23  1113      History   Chief Complaint Chief Complaint  Patient presents with   Back Pain    Entered by patient    HPI Shelby Patel is a 63 y.o. female.   HPI Very pleasant 63 year old female presents with low back pain for 2 days.  Patient reports throwing her back out while performing activities at home.  Patient has taken Tylenol  and Motrin  with minimal improvement.  PMH significant for osteopenia.  Past Medical History:  Diagnosis Date   Diverticulitis     Patient Active Problem List   Diagnosis Date Noted   Routine adult health maintenance 07/22/2023   Tinnitus of left ear 07/22/2023   Osteopenia 07/22/2023    Past Surgical History:  Procedure Laterality Date   EXTERNAL EAR SURGERY Left    FOOT SURGERY Bilateral    KNEE SURGERY     KNEE SURGERY Right     OB History   No obstetric history on file.      Home Medications    Prior to Admission medications   Medication Sig Start Date End Date Taking? Authorizing Provider  methocarbamol  (ROBAXIN ) 500 MG tablet Take 1 tablet (500 mg total) by mouth 2 (two) times daily. 12/21/23  Yes Teddy Sharper, FNP  predniSONE  (DELTASONE ) 50 MG tablet Take 1 tab p.o. daily for 5 days. 12/21/23  Yes Teddy Sharper, FNP  b complex vitamins capsule Take 1 capsule by mouth daily.    [provider]  Cholecalciferol 50 MCG (2000 UT) CAPS Take by mouth. 05/13/17   [provider]    Family History Family History  Problem Relation Age of Onset   Cancer Mother    Heart disease Mother    Hyperlipidemia Mother    Hypertension Mother    Varicose Veins Mother    Cancer Father    Hyperlipidemia Father    Hypertension Father    Heart disease Maternal Grandfather    Hypertension Brother    Kidney disease Brother     Social History Social History   Tobacco Use   Smoking status: Never   Smokeless tobacco: Never  Substance Use Topics    Alcohol use: Not Currently   Drug use: No     Allergies   Penicillins   Review of Systems Review of Systems  Musculoskeletal:  Positive for back pain.     Physical Exam Triage Vital Signs ED Triage Vitals  Encounter Vitals Group     BP 12/21/23 1126 (!) 160/91     Girls Systolic BP Percentile --      Girls Diastolic BP Percentile --      Boys Systolic BP Percentile --      Boys Diastolic BP Percentile --      Pulse Rate 12/21/23 1126 71     Resp 12/21/23 1126 19     Temp 12/21/23 1126 98.3 F (36.8 C)     Temp src --      SpO2 12/21/23 1126 98 %     Weight --      Height --      Head Circumference --      Peak Flow --      Pain Score 12/21/23 1125 8     Pain Loc --      Pain Education --      Exclude from Growth Chart --    No data found.  Updated Vital  Signs BP (!) 160/91   Pulse 71   Temp 98.3 F (36.8 C)   Resp 19   SpO2 98%    Physical Exam Vitals and nursing note reviewed.  Constitutional:      Appearance: Normal appearance. She is normal weight.  HENT:     Head: Normocephalic and atraumatic.     Mouth/Throat:     Mouth: Mucous membranes are moist.     Pharynx: Oropharynx is clear.  Eyes:     Extraocular Movements: Extraocular movements intact.     Conjunctiva/sclera: Conjunctivae normal.     Pupils: Pupils are equal, round, and reactive to light.  Cardiovascular:     Rate and Rhythm: Normal rate and regular rhythm.     Pulses: Normal pulses.     Heart sounds: Normal heart sounds.  Pulmonary:     Effort: Pulmonary effort is normal.     Breath sounds: Normal breath sounds. No wheezing, rhonchi or rales.  Musculoskeletal:        General: Normal range of motion.     Comments: Lumbar spine: TTP over bilateral paraspinous muscles/bilateral inferior spinal erectors, no deformity noted  Skin:    General: Skin is warm and dry.  Neurological:     General: No focal deficit present.     Mental Status: She is alert and oriented to person,  place, and time. Mental status is at baseline.  Psychiatric:        Mood and Affect: Mood normal.        Behavior: Behavior normal.      UC Treatments / Results  Labs (all labs ordered are listed, but only abnormal results are displayed) Labs Reviewed - No data to display  EKG   Radiology No results found.  Procedures Procedures (including critical care time)  Medications Ordered in UC Medications  methylPREDNISolone  acetate (DEPO-MEDROL ) injection 80 mg (80 mg Intramuscular Given 12/21/23 1158)    Initial Impression / Assessment and Plan / UC Course  I have reviewed the triage vital signs and the nursing notes.  Pertinent labs & imaging results that were available during my care of the patient were reviewed by me and considered in my medical decision making (see chart for details).     MDM: 1.  Strain of lumbar region, initial encounter-IM Depo Medrol  80 mg given once in clinic, Rx'd prednisone  50 mg tablet: Take 1 tablet daily x 5 days; 2.  Muscle spasm of back-Rx'd Robaxin  500 mg tablet: Take 1-2 tablets daily, as needed for muscle spasms of back. Advised patient to take medication as directed with food to completion.  Advised may take Robaxin  daily or as needed for accompanying muscle spasms of back.  Encouraged to increase daily water intake to 64 ounces per day while taking these medications.  Advised if symptoms worsen and/or unresolved please follow with your PCP or here for further evaluation.  Final Clinical Impressions(s) / UC Diagnoses   Final diagnoses:  Strain of lumbar region, initial encounter  Muscle spasm of back     Discharge Instructions      Advised patient to take medication as directed with food to completion.  Advised may take Robaxin  daily or as needed for accompanying muscle spasms of back.  Encouraged to increase daily water intake to 64 ounces per day while taking these medications.  Advised if symptoms worsen and/or unresolved please follow  with your PCP or here for further evaluation.     ED Prescriptions     Medication  Sig Dispense Auth. Provider   predniSONE  (DELTASONE ) 50 MG tablet Take 1 tab p.o. daily for 5 days. 5 tablet Jawanza Zambito, FNP   methocarbamol  (ROBAXIN ) 500 MG tablet Take 1 tablet (500 mg total) by mouth 2 (two) times daily. 20 tablet Angelette Ganus, FNP      PDMP not reviewed this encounter.   Teddy Sharper, FNP 12/21/23 1220

## 2023-12-21 NOTE — Discharge Instructions (Addendum)
 Advised patient to take medication as directed with food to completion.  Advised may take Robaxin  daily or as needed for accompanying muscle spasms of back.  Encouraged to increase daily water intake to 64 ounces per day while taking these medications.  Advised if symptoms worsen and/or unresolved please follow with your PCP or here for further evaluation.

## 2024-01-26 ENCOUNTER — Ambulatory Visit
Admission: RE | Admit: 2024-01-26 | Discharge: 2024-01-26 | Disposition: A | Discharge status: Home / Self Care | Attending: Family Medicine | Admitting: Family Medicine

## 2024-01-26 VITALS — BP 166/96 | HR 70 | Temp 98.7°F | Resp 18

## 2024-01-26 DIAGNOSIS — M542 Cervicalgia: Secondary | ICD-10-CM

## 2024-01-26 DIAGNOSIS — S161XXA Strain of muscle, fascia and tendon at neck level, initial encounter: Secondary | ICD-10-CM

## 2024-01-26 DIAGNOSIS — M47812 Spondylosis without myelopathy or radiculopathy, cervical region: Secondary | ICD-10-CM

## 2024-01-26 MED ORDER — CELECOXIB 200 MG PO CAPS: 200.0000 mg | ORAL_CAPSULE | Freq: Every day | ORAL | 0 refills | Status: AC

## 2024-01-26 MED ORDER — PREDNISONE 10 MG (21) PO TBPK: ORAL_TABLET | Freq: Every day | ORAL | 0 refills | Status: DC

## 2024-01-26 MED ORDER — OXYCODONE-ACETAMINOPHEN 5-325 MG PO TABS: 1.0000 | ORAL_TABLET | Freq: Four times a day (QID) | ORAL | 0 refills | Status: DC | PRN

## 2024-01-26 NOTE — Discharge Instructions (Addendum)
 Advised patient to take medications as directed with food to completion.  Advised may take Percocet for acute/severe neck pain.  Patient advised of sedative effects of Percocet.  Encouraged to increase daily water intake to 64 ounces per day while taking this medication.  Advised if symptoms worsen and/or unresolved please follow-up with your PCP or here for further evaluation.

## 2024-01-26 NOTE — ED Triage Notes (Addendum)
 Pt reports neck pain x 1 week. States believes she has a pinched nerve and reports the pain radiates into left shoulder. Has tried Tylenol , Ibuprofen , biofreeze and a left over muscle relaxer. Denies any obvious injuries

## 2024-01-26 NOTE — ED Provider Notes (Signed)
 [name]    CSN: [id number] Arrival date & time: 01/26/24  1742      History   Chief Complaint Chief Complaint  Patient presents with   Neck Pain    HPI Johnda Billiot is a 63 y.o. female.   HPI 63 year old female presents with neck pain for 1 week believes that she has a pinched nerve.  Patient reports taking OTC Tylenol , ibuprofen , and Biofreeze with leftover muscle relaxer.  Patient reports pain last night was 15 on 0 through 10 scale.  Reports Robaxin  muscle relaxer did not work effectively.  PMH significant for diverticulitis, osteopenia, and tendinitis of left ear.  Past Medical History:  Diagnosis Date   Diverticulitis     Patient Active Problem List   Diagnosis Date Noted   Routine adult health maintenance 07/22/2023   Tinnitus of left ear 07/22/2023   Osteopenia 07/22/2023    Past Surgical History:  Procedure Laterality Date   EXTERNAL EAR SURGERY Left    FOOT SURGERY Bilateral    KNEE SURGERY     KNEE SURGERY Right     OB History   No obstetric history on file.      Home Medications    Prior to Admission medications   Medication Sig Start Date End Date Taking? Authorizing Provider  celecoxib  (CELEBREX ) 200 MG capsule Take 1 capsule (200 mg total) by mouth daily for 15 days. 01/26/24 02/10/24 Yes Teddy Sharper, FNP  oxyCODONE -acetaminophen  (PERCOCET/ROXICET) 5-325 MG tablet Take 1 tablet by mouth every 6 (six) hours as needed for severe pain (pain score 7-10). 01/26/24  Yes Teddy Sharper, FNP  predniSONE  (STERAPRED UNI-PAK 21 TAB) 10 MG (21) TBPK tablet Take by mouth daily. Take 6 tabs by mouth daily  for 2 days, then 5 tabs for 2 days, then 4 tabs for 2 days, then 3 tabs for 2 days, 2 tabs for 2 days, then 1 tab by mouth daily for 2 days 01/26/24  Yes Teddy Sharper, FNP  b complex vitamins capsule Take 1 capsule by mouth daily.    [provider]  Cholecalciferol 50 MCG (2000 UT) CAPS Take by mouth. 05/13/17   [provider]  methocarbamol  (ROBAXIN ) 500 MG tablet Take 1 tablet (500 mg total) by mouth 2 (two) times daily. 12/21/23   Teddy Sharper, FNP    Family History Family History  Problem Relation Age of Onset   Cancer Mother    Heart disease Mother    Hyperlipidemia Mother    Hypertension Mother    Varicose Veins Mother    Cancer Father    Hyperlipidemia Father    Hypertension Father    Heart disease Maternal Grandfather    Hypertension Brother    Kidney disease Brother     Social History Social History   Tobacco Use   Smoking status: Never   Smokeless tobacco: Never  Substance Use Topics   Alcohol use: Not Currently   Drug use: No     Allergies   Penicillins   Review of Systems Review of Systems  Musculoskeletal:  Positive for neck pain.  All other systems reviewed and are negative.    Physical Exam Triage Vital Signs ED Triage Vitals [01/26/24 1751]  Encounter Vitals Group     BP      Girls Systolic BP Percentile      Girls Diastolic BP Percentile      Boys Systolic BP Percentile      Boys Diastolic BP Percentile  Pulse      Resp      Temp      Temp src      SpO2      Weight      Height      Head Circumference      Peak Flow      Pain Score 8     Pain Loc      Pain Education      Exclude from Growth Chart    No data found.  Updated Vital Signs BP (!) 166/96 (BP Location: Right Arm)   Pulse 70   Temp 98.7 F (37.1 C) (Oral)   Resp 18   SpO2 96%    Physical Exam Vitals and nursing note reviewed.  Constitutional:      Appearance: Normal appearance. She is normal weight.  HENT:     Head: Normocephalic and atraumatic.     Mouth/Throat:     Mouth: Mucous membranes are moist.     Pharynx: Oropharynx is clear.  Eyes:     Extraocular Movements: Extraocular movements intact.     Pupils: Pupils are equal, round, and reactive to light.  Cardiovascular:     Rate and Rhythm: Normal rate and regular rhythm.     Pulses: Normal pulses.     Heart  sounds: Normal heart sounds.  Pulmonary:     Effort: Pulmonary effort is normal.     Breath sounds: Normal breath sounds. No wheezing, rhonchi or rales.  Musculoskeletal:        General: Normal range of motion.     Cervical back: Normal range of motion and neck supple.  Skin:    General: Skin is warm and dry.  Neurological:     General: No focal deficit present.     Mental Status: She is alert and oriented to person, place, and time. Mental status is at baseline.      UC Treatments / Results  Labs (all labs ordered are listed, but only abnormal results are displayed) Labs Reviewed - No data to display  EKG   Radiology No results found.  Procedures Procedures (including critical care time)  Medications Ordered in UC Medications - No data to display  Initial Impression / Assessment and Plan / UC Course  I have reviewed the triage vital signs and the nursing notes.  Pertinent labs & imaging results that were available during my care of the patient were reviewed by me and considered in my medical decision making (see chart for details).     MDM: 1.  Cervical spondylosis without myelopathy-please see CT angiogram head and neck with without contrast of 08/25/2023, Rx'd Celebrex  200 mg capsule: Take 1 capsule daily x 15 days; 2.  Acute strain of neck muscle, initial encounter-Rx'd Sterapred Unipak (42 tab 10 mg taper); 3.  Neck pain-Rx'd Percocet 5/325 mg tablet: Take 1 tablet every 8 hours, as needed for severe neck pain. Advised patient to take medications as directed with food to completion.  Advised may take Percocet for acute/severe neck pain.  Patient advised of sedative effects of Percocet.  Encouraged to increase daily water intake to 64 ounces per day while taking this medication.  Advised if symptoms worsen and/or unresolved please follow-up with your PCP or here for further evaluation.  Patient discharged home, hemodynamically stable. Final Clinical Impressions(s) / UC  Diagnoses   Final diagnoses:  Acute strain of neck muscle, initial encounter  Cervical spondylosis without myelopathy  Neck pain  Discharge Instructions      Advised patient to take medications as directed with food to completion.  Advised may take Percocet for acute/severe neck pain.  Patient advised of sedative effects of Percocet.  Encouraged to increase daily water intake to 64 ounces per day while taking this medication.  Advised if symptoms worsen and/or unresolved please follow-up with your PCP or here for further evaluation.     ED Prescriptions     Medication Sig Dispense Auth. Provider   predniSONE  (STERAPRED UNI-PAK 21 TAB) 10 MG (21) TBPK tablet Take by mouth daily. Take 6 tabs by mouth daily  for 2 days, then 5 tabs for 2 days, then 4 tabs for 2 days, then 3 tabs for 2 days, 2 tabs for 2 days, then 1 tab by mouth daily for 2 days 42 tablet Teddy Sharper, FNP   celecoxib  (CELEBREX ) 200 MG capsule Take 1 capsule (200 mg total) by mouth daily for 15 days. 15 capsule Lynnette Pote, FNP   oxyCODONE -acetaminophen  (PERCOCET/ROXICET) 5-325 MG tablet Take 1 tablet by mouth every 6 (six) hours as needed for severe pain (pain score 7-10). 15 tablet Sabryna Lahm, FNP      I have reviewed the PDMP during this encounter.   Teddy Sharper, FNP 01/26/24 [phone]

## 2024-02-04 ENCOUNTER — Telehealth: Payer: Self-pay

## 2024-02-04 NOTE — Telephone Encounter (Signed)
 Pt called reporting need for xray of neck. Reported to patient she can follow back up with us  for the xray, pcp or sports medicine.

## 2024-02-10 ENCOUNTER — Ambulatory Visit
Admission: RE | Admit: 2024-02-10 | Discharge: 2024-02-10 | Disposition: A | Discharge status: Home / Self Care | Source: Ambulatory Visit | Attending: Family Medicine | Admitting: Family Medicine

## 2024-02-10 VITALS — BP 157/96 | HR 65 | Temp 98.1°F | Resp 19

## 2024-02-10 DIAGNOSIS — L509 Urticaria, unspecified: Secondary | ICD-10-CM | POA: Diagnosis not present

## 2024-02-10 DIAGNOSIS — R21 Rash and other nonspecific skin eruption: Secondary | ICD-10-CM | POA: Diagnosis not present

## 2024-02-10 MED ORDER — PREDNISONE 10 MG (21) PO TBPK: ORAL_TABLET | Freq: Every day | ORAL | 0 refills | Status: DC

## 2024-02-10 MED ORDER — METHYLPREDNISOLONE SODIUM SUCC 125 MG IJ SOLR
125.0000 mg | Freq: Once | INTRAMUSCULAR | Status: AC
  Administered 2024-02-10: 125 mg via INTRAMUSCULAR

## 2024-02-10 MED ORDER — FEXOFENADINE HCL 180 MG PO TABS: 180.0000 mg | ORAL_TABLET | Freq: Every day | ORAL | 0 refills | Status: DC

## 2024-02-10 NOTE — ED Triage Notes (Signed)
 Pt presents to uc with co hives all over mainly on the stomach. Pt reports she just returned home from [place]  and has been eating seafood, new hotel etc so she is unsure of the cause. Pt reports she did take benadryl last night and zyrtec this night.

## 2024-02-10 NOTE — Discharge Instructions (Addendum)
 Advised patient to take medications as directed with food to completion.  Advised patient to take Allegra  daily for the next 7 days.  Encouraged to increase daily water intake to 64 ounces per day while taking these medications.  Advised if symptoms worsen and/or unresolved please follow-up with your PCP or here for further evaluation.

## 2024-02-10 NOTE — ED Provider Notes (Signed)
 Shelby Patel    CSN: [id number] Arrival date & time: 02/10/24  9073      History   Chief Complaint Chief Complaint  Patient presents with   Allergic Reaction    Hives - Entered by patient    HPI Shelby Patel is a 63 y.o. female.   HPI pleasant 63 year old female presents for allergic reaction and hives.  Patient reports hives all over her body but mainly on stomach.  Patient reports on vacation in [place]  and now just returning home.  Patient reports that she has eaten seafood and stayed in new hotel and is unsure of the cause of her rash.  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      Home Medications    Prior to Admission medications   Medication Sig Start Date End Date Taking? Authorizing Provider  fexofenadine  (ALLEGRA  ALLERGY) 180 MG tablet Take 1 tablet (180 mg total) by mouth daily for 15 days. 02/10/24 02/25/24 Yes Teddy Sharper, FNP  predniSONE  (STERAPRED UNI-PAK 21 TAB) 10 MG (21) TBPK tablet Take by mouth daily. Take 6 tabs by mouth daily  for 2 days, then 5 tabs for 2 days, then 4 tabs for 2 days, then 3 tabs for 2 days, 2 tabs for 2 days, then 1 tab by mouth daily for 2 days 02/10/24  Yes Teddy Sharper, FNP  b complex vitamins capsule Take 1 capsule by mouth daily.    [provider]  celecoxib  (CELEBREX ) 200 MG capsule Take 1 capsule (200 mg total) by mouth daily for 15 days. 01/26/24 02/10/24  Teddy Sharper, FNP  Cholecalciferol 50 MCG (2000 UT) CAPS Take by mouth. 05/13/17   [provider]  methocarbamol  (ROBAXIN ) 500 MG tablet  Take 1 tablet (500 mg total) by mouth 2 (two) times daily. 12/21/23   Teddy Sharper, FNP  oxyCODONE -acetaminophen  (PERCOCET/ROXICET) 5-325 MG tablet Take 1 tablet by mouth every 6 (six) hours as needed for severe pain (pain score 7-10). 01/26/24   Teddy Sharper, FNP    Family History Family History  Problem Relation Age of Onset   Cancer Mother    Heart disease Mother    Hyperlipidemia Mother    Hypertension Mother    Varicose Veins Mother    Cancer Father    Hyperlipidemia Father    Hypertension Father    Heart disease Maternal Grandfather    Hypertension Brother    Kidney disease Brother     Social History Social History   Tobacco Use   Smoking status: Never   Smokeless tobacco: Never  Substance Use Topics   Alcohol use: Not Currently   Drug use: No     Allergies   Penicillins   Review of Systems Review of Systems  Skin:  Positive for rash.     Physical Exam Triage Vital Signs ED Triage Vitals  Encounter Vitals Group     BP      Girls Systolic BP Percentile      Girls Diastolic BP Percentile  Boys Systolic BP Percentile      Boys Diastolic BP Percentile      Pulse      Resp      Temp      Temp src      SpO2      Weight      Height      Head Circumference      Peak Flow      Pain Score      Pain Loc      Pain Education      Exclude from Growth Chart    No data found.  Updated Vital Signs BP (!) 157/96   Pulse 65   Temp 98.1 F (36.7 C)   Resp 19   SpO2 98%    Physical Exam Vitals and nursing note reviewed.  Constitutional:      General: She is not in acute distress.    Appearance: Normal appearance. She is normal weight. She is not ill-appearing.  HENT:     Head: Normocephalic and atraumatic.     Mouth/Throat:     Mouth: Mucous membranes are moist.     Pharynx: Oropharynx is clear.  Eyes:     Extraocular Movements: Extraocular movements intact.     Pupils: Pupils are equal, round, and reactive to light.  Cardiovascular:      Rate and Rhythm: Normal rate and regular rhythm.     Pulses: Normal pulses.     Heart sounds: Normal heart sounds.  Pulmonary:     Effort: Pulmonary effort is normal.     Breath sounds: Normal breath sounds. No wheezing, rhonchi or rales.  Musculoskeletal:        General: Normal range of motion.  Skin:    General: Skin is warm and dry.     Comments: Face/abdomen/back: Pruritic, erythematous maculopapular eruption with coalesced annular plaques with well demarcated borders-please see images below  Neurological:     General: No focal deficit present.     Mental Status: She is alert and oriented to person, place, and time. Mental status is at baseline.  Psychiatric:        Mood and Affect: Mood normal.        Behavior: Behavior normal.           UC Treatments / Results  Labs (all labs ordered are listed, but only abnormal results are displayed) Labs Reviewed - No data to display  EKG   Radiology No results found.  Procedures Procedures (including critical Patel time)  Medications Ordered in UC Medications  methylPREDNISolone  sodium succinate (SOLU-MEDROL ) 125 mg/2 mL injection 125 mg (125 mg Intramuscular Given 02/10/24 0957)    Initial Impression / Assessment and Plan / UC Course  I have reviewed the triage vital signs and the nursing notes.  Pertinent labs & imaging results that were available during my Patel of the patient were reviewed by me and considered in my medical decision making (see chart for details).     MDM: 1.  Rash and nonspecific skin eruption-IM Solu-Medrol  125 mg given once in clinic and prior to discharge, Rx'd Sterapred Unipak (42 tab 10 mg taper); 2.  Urticaria-Rx'd Allegra  180 mg tablet: Take 1 tablet daily x 7 days. Advised patient to take medications as directed with food to completion.  Advised patient to take Allegra  daily for the next 7 days.  Encouraged to increase daily water intake to 64 ounces per day while taking these medications.   Advised if symptoms worsen  and/or unresolved please follow-up with your PCP or here for further evaluation.  Patient discharged home, hemodynamically stable. Final Clinical Impressions(s) / UC Diagnoses   Final diagnoses:  Urticaria  Rash and nonspecific skin eruption     Discharge Instructions      Advised patient to take medications as directed with food to completion.  Advised patient to take Allegra  daily for the next 7 days.  Encouraged to increase daily water intake to 64 ounces per day while taking these medications.  Advised if symptoms worsen and/or unresolved please follow-up with your PCP or here for further evaluation.     ED Prescriptions     Medication Sig Dispense Auth. Provider   fexofenadine  (ALLEGRA  ALLERGY) 180 MG tablet Take 1 tablet (180 mg total) by mouth daily for 15 days. 15 tablet Alyssamae Klinck, FNP   predniSONE  (STERAPRED UNI-PAK 21 TAB) 10 MG (21) TBPK tablet Take by mouth daily. Take 6 tabs by mouth daily  for 2 days, then 5 tabs for 2 days, then 4 tabs for 2 days, then 3 tabs for 2 days, 2 tabs for 2 days, then 1 tab by mouth daily for 2 days 42 tablet Teddy Sharper, FNP      PDMP not reviewed this encounter.   Teddy Sharper, FNP 02/10/24 1002

## 2024-02-11 ENCOUNTER — Telehealth: Payer: Self-pay

## 2024-02-11 ENCOUNTER — Other Ambulatory Visit: Payer: Self-pay

## 2024-02-11 ENCOUNTER — Encounter (HOSPITAL_BASED_OUTPATIENT_CLINIC_OR_DEPARTMENT_OTHER): Payer: Self-pay

## 2024-02-11 ENCOUNTER — Emergency Department (HOSPITAL_BASED_OUTPATIENT_CLINIC_OR_DEPARTMENT_OTHER)
Admission: EM | Admit: 2024-02-11 | Discharge: 2024-02-11 | Disposition: A | Discharge status: Home / Self Care | Attending: Emergency Medicine | Admitting: Emergency Medicine

## 2024-02-11 DIAGNOSIS — R21 Rash and other nonspecific skin eruption: Secondary | ICD-10-CM | POA: Diagnosis not present

## 2024-02-11 DIAGNOSIS — L509 Urticaria, unspecified: Secondary | ICD-10-CM | POA: Diagnosis not present

## 2024-02-11 LAB — CBC WITH DIFFERENTIAL/PLATELET
Abs Immature Granulocytes: 0.04 K/uL (ref 0.00–0.07)
Basophils Absolute: 0 K/uL (ref 0.0–0.1)
Basophils Relative: 0 %
Eosinophils Absolute: 0.2 K/uL (ref 0.0–0.5)
Eosinophils Relative: 2 %
HCT: 41.3 % (ref 36.0–46.0)
Hemoglobin: 13.4 g/dL (ref 12.0–15.0)
Immature Granulocytes: 1 %
Lymphocytes Relative: 6 %
Lymphs Abs: 0.4 K/uL — ABNORMAL LOW (ref 0.7–4.0)
MCH: 33.2 pg (ref 26.0–34.0)
MCHC: 32.4 g/dL (ref 30.0–36.0)
MCV: 102.2 fL — ABNORMAL HIGH (ref 80.0–100.0)
Monocytes Absolute: 0.4 K/uL (ref 0.1–1.0)
Monocytes Relative: 5 %
Neutro Abs: 6.7 K/uL (ref 1.7–7.7)
Neutrophils Relative %: 86 %
Platelets: 259 K/uL (ref 150–400)
RBC: 4.04 MIL/uL (ref 3.87–5.11)
RDW: 13.2 % (ref 11.5–15.5)
WBC: 7.8 K/uL (ref 4.0–10.5)
nRBC: 0 % (ref 0.0–0.2)

## 2024-02-11 LAB — COMPREHENSIVE METABOLIC PANEL WITH GFR
ALT: 26 U/L (ref 0–44)
AST: 27 U/L (ref 15–41)
Albumin: 4.7 g/dL (ref 3.5–5.0)
Alkaline Phosphatase: 70 U/L (ref 38–126)
Anion gap: 11 (ref 5–15)
BUN: 18 mg/dL (ref 8–23)
CO2: 29 mmol/L (ref 22–32)
Calcium: 10.5 mg/dL — ABNORMAL HIGH (ref 8.9–10.3)
Chloride: 101 mmol/L (ref 98–111)
Creatinine, Ser: 0.87 mg/dL (ref 0.44–1.00)
GFR, Estimated: >60 mL/min (ref >60)
Glucose, Bld: 89 mg/dL (ref 70–99)
Potassium: 3.8 mmol/L (ref 3.5–5.1)
Sodium: 141 mmol/L (ref 135–145)
Total Bilirubin: 0.3 mg/dL (ref 0.0–1.2)
Total Protein: 7.6 g/dL (ref 6.5–8.1)

## 2024-02-11 NOTE — Telephone Encounter (Signed)
 Called to check on patient. Left voicemail. No answer.

## 2024-02-11 NOTE — ED Triage Notes (Signed)
 Pt presents with rash on face, upper body, and lower body. Rash started on Saturday on stomach and has spread since. Pt states they took a trip to [place]  which is when this began. Complains of mild itching to back. Pt does not complain of any difficulty swallowing. States they have tried antihistamines.

## 2024-02-11 NOTE — ED Provider Notes (Signed)
 [name] [facility] Provider Note   CSN: [id number] Arrival date & time: 02/11/24  9193     Patient presents with: Rash   Shelby Patel is a 63 y.o. female.   63 year old female previously healthy presents to the emergency department with a rash.  Says that she returned from [place]  recently.  On Saturday started having a rash that started on her abdomen and now has spread to her back and extremities and face.  Itches but not painful.  No new medications.  No new lotions.  No insect or poison ivy exposures that she knows of that would have caused this.  Recently stopped a prednisone  taper for some back pain that she was having.  Went to urgent care yesterday and was prescribed a steroid Dosepak and given a shot of Solu-Medrol .  Has been trying calamine lotion and several other topical treatments for this.  Says that she had some bleeding when she brushes her teeth today so decided to come in.       Prior to Admission medications   Medication Sig Start Date End Date Taking? Authorizing Provider  b complex vitamins capsule Take 1 capsule by mouth daily.    [provider]  Cholecalciferol 50 MCG (2000 UT) CAPS Take by mouth. 05/13/17   [provider]  fexofenadine  (ALLEGRA  ALLERGY) 180 MG tablet Take 1 tablet (180 mg total) by mouth daily for 15 days. 02/10/24 02/25/24  Shelby Sharper, FNP  methocarbamol  (ROBAXIN ) 500 MG tablet Take 1 tablet (500 mg total) by mouth 2 (two) times daily. 12/21/23   Shelby Sharper, FNP  oxyCODONE -acetaminophen  (PERCOCET/ROXICET) 5-325 MG tablet Take 1 tablet by mouth every 6 (six) hours as needed for severe pain (pain score 7-10). 01/26/24   Shelby Sharper, FNP  predniSONE  (STERAPRED UNI-PAK 21 TAB) 10 MG (21) TBPK tablet Take by mouth daily. Take 6 tabs by mouth daily  for 2 days, then 5 tabs for 2 days, then 4 tabs for 2 days, then 3 tabs for 2 days, 2 tabs for 2 days, then 1 tab by mouth daily for 2 days  02/10/24   Shelby Sharper, FNP    Allergies: Penicillins    Review of Systems  Updated Vital Signs BP (!) 175/99 (BP Location: Right Arm)   Pulse 86   Temp 98.4 F (36.9 C) (Oral)   Resp 18   Ht 5' 8 (1.727 m)   Wt 55.3 kg   SpO2 98%   BMI 18.55 kg/m   Physical Exam Constitutional:      Appearance: Normal appearance.  Pulmonary:     Effort: Pulmonary effort is normal. No respiratory distress.  Skin:    Findings: Rash present.     Comments: See images below.  Does not involve the oral mucosa or palms and soles.  Blanching.  Raised welts.  Neurological:     Mental Status: She is alert.    Rash:     (all labs ordered are listed, but only abnormal results are displayed) Labs Reviewed  CBC WITH DIFFERENTIAL/PLATELET - Abnormal; Notable for the following components:      Result Value   MCV 102.2 (*)    Lymphs Abs 0.4 (*)    All other components within normal limits  COMPREHENSIVE METABOLIC PANEL WITH GFR - Abnormal; Notable for the following components:   Calcium 10.5 (*)    All other components within normal limits    EKG: None  Radiology: No results found.   Procedures  Medications Ordered in the ED - No data to display                                  Medical Decision Making Amount and/or Complexity of Data Reviewed Labs: ordered.   Shelby Patel is a 63 year old female no relevant past medical history who presents to the emergency department with a rash  Initial Ddx:  Urticaria, allergic reaction, contact dermatitis, swimmer's itch, SJS/TEN  MDM/Course:  Patient presents to the emergency department with diffuse rash.  Was in [place]  recently.  Unclear exactly what the cause is at this point. On exam has a diffuse urticarial rash.  Does not appear to be having any signs of anaphylaxis currently.  Did complain of some gingival bleeding which I suspect was an incidental since I do not see any lesions in her mouth.  Does not appear to involve the  palms or soles either.  Low concern for SJS/TEN.  Was prescribed steroid taper which she has not yet started taking.  I counseled her to take this and follow-up with dermatology whom I have messaged about setting up an appointment for her to be seen.  Also counseled her to take antihistamines for her itching and discomfort.  This patient presents to the ED for concern of complaints listed in HPI, this involves an extensive number of treatment options, and is a complaint that carries with it a high risk of complications and morbidity. Disposition including potential need for admission considered.   Dispo: DC Home. Return precautions discussed including, but not limited to, those listed in the AVS. Allowed pt time to ask questions which were answered fully prior to dc.  Additional history obtained from spouse Records reviewed Outpatient Clinic Notes I have reviewed the patients home medications and made adjustments as needed Social Determinants of health:  Geriatric  Portions of this note were generated with Scientist, clinical (histocompatibility and immunogenetics). Dictation errors may occur despite best attempts at proofreading.     Final diagnoses:  Urticaria    ED Discharge Orders     None          Shelby Lamar BROCKS, MD 02/11/24 [id number]

## 2024-02-11 NOTE — Discharge Instructions (Signed)
 You were seen for your rash in the emergency department.   At home, please take the steroids that you are prescribed.  You may also take Zyrtec during the day and Benadryl at night for the itching.    Check your MyChart online for the results of any tests that had not resulted by the time you left the emergency department.   Follow-up with your primary doctor in 2-3 days regarding your visit.  Follow-up with dermatology as soon as possible.  Return immediately to the emergency department if you experience any of the following: Skin sloughing off, rash inside your mouth, or any other concerning symptoms.    Thank you for visiting our Emergency Department. It was a pleasure taking care of you today.

## 2024-02-12 DIAGNOSIS — D485 Neoplasm of uncertain behavior of skin: Secondary | ICD-10-CM | POA: Diagnosis not present

## 2024-02-12 DIAGNOSIS — L270 Generalized skin eruption due to drugs and medicaments taken internally: Secondary | ICD-10-CM | POA: Diagnosis not present

## 2024-02-12 DIAGNOSIS — L308 Other specified dermatitis: Secondary | ICD-10-CM | POA: Diagnosis not present

## 2024-02-12 DIAGNOSIS — L309 Dermatitis, unspecified: Secondary | ICD-10-CM | POA: Diagnosis not present

## 2024-02-24 ENCOUNTER — Encounter: Payer: Self-pay | Admitting: Family Medicine

## 2024-02-24 ENCOUNTER — Ambulatory Visit: Admitting: Family Medicine

## 2024-02-24 VITALS — BP 130/100 | HR 85 | Temp 98.5°F | Ht 68.0 in | Wt 119.0 lb

## 2024-02-24 DIAGNOSIS — M8580 Other specified disorders of bone density and structure, unspecified site: Secondary | ICD-10-CM | POA: Diagnosis not present

## 2024-02-24 DIAGNOSIS — Z23 Encounter for immunization: Secondary | ICD-10-CM

## 2024-02-24 DIAGNOSIS — T7840XD Allergy, unspecified, subsequent encounter: Secondary | ICD-10-CM

## 2024-02-24 DIAGNOSIS — M542 Cervicalgia: Secondary | ICD-10-CM

## 2024-02-24 DIAGNOSIS — T7840XS Allergy, unspecified, sequela: Secondary | ICD-10-CM

## 2024-02-24 DIAGNOSIS — I10 Essential (primary) hypertension: Secondary | ICD-10-CM

## 2024-02-24 DIAGNOSIS — E7800 Pure hypercholesterolemia, unspecified: Secondary | ICD-10-CM | POA: Diagnosis not present

## 2024-02-24 DIAGNOSIS — Z7689 Persons encountering health services in other specified circumstances: Secondary | ICD-10-CM

## 2024-02-24 MED ORDER — ROSUVASTATIN CALCIUM 10 MG PO TABS: 10.0000 mg | ORAL_TABLET | Freq: Every day | ORAL | 1 refills | Status: DC

## 2024-02-24 MED ORDER — ROSUVASTATIN CALCIUM 20 MG PO TABS
20.0000 mg | ORAL_TABLET | Freq: Every day | ORAL | 1 refills | Status: DC
Start: 2024-02-24 — End: 2024-02-24

## 2024-02-24 MED ORDER — AMLODIPINE BESYLATE 5 MG PO TABS
5.0000 mg | ORAL_TABLET | Freq: Every day | ORAL | 1 refills | Status: DC
Start: 2024-02-24 — End: 2024-04-28

## 2024-02-24 NOTE — Progress Notes (Signed)
 Patient Office Visit  Assessment & Plan:  Encounter to establish care  Immunization due -     Flu vaccine trivalent PF, 6mos and older(Flulaval,Afluria,Fluarix,Fluzone)  Need for pneumococcal 20-valent conjugate vaccination -     Pneumococcal conjugate vaccine 20-valent  Cervicalgia  Osteopenia, unspecified location  Hypertension, unspecified type -     amLODIPine  Besylate; Take 1 tablet (5 mg total) by mouth daily.  Dispense: 90 tablet; Refill: 1  Elevated cholesterol -     Rosuvastatin  Calcium ; Take 1 tablet (10 mg total) by mouth daily.  Dispense: 90 tablet; Refill: 1  Allergic reaction to drug, sequela   Assessment and Plan    Hypertension new onset Blood pressure elevated, family history present, asymptomatic. - Start low-dose antihypertensive medication at 5 mg. - Monitor blood pressure regularly. - Follow up in a few weeks to assess response.  Hyperlipidemia Elevated LDL with family history of heart disease. Discussed statin benefits and calcium  score test option. - Start low-dose statin at Crestor  10 mg nightly. - Recheck cholesterol levels in 4-6 weeks. - Consider calcium  score test if hesitant about statin.  Cervical spondylosis Mild arthritis with significant symptom improvement, no further imaging needed unless symptoms worsen. - Consider physical therapy if symptoms worsen. - Monitor symptoms and consider imaging if significant change.  Tinnitus left sided Intermittent left ear ringing, previous angiogram normal, mild arthritis not causative. - Monitor symptoms. - Consider further imaging if symptoms worsen.  Hypercalcemia Mild Slightly elevated calcium , previous levels normal, no related history. - Monitor calcium  levels.     Test results were reviewed and analyzed as part of the medical decision making of this visit.  Reviewed previous notes from [facility], [facility] and imaging studies that were previously done.  Flu  shot given today, pneumococcal vaccine given today.  Start Norvasc  5 mg once a day, Crestor  10 mg once a day.  Return to next 4 to 6 weeks or sooner if necessary. Recommend healthy diet i.e mediterranean/DASH diet, consistent exercise - 30 minutes 5 day per week, and gradual weight loss.  Return in about 6 weeks (around 04/06/2024), or if symptoms worsen or fail to improve.   Subjective:    Patient ID: Shelby Patel, female    DOB: 1961/02/11  Age: 63 y.o. MRN: [id number]  Chief Complaint  Patient presents with   Establish Care   Medical Management of Chronic Issues    HPI Discussed the use of AI scribe software for clinical note transcription with the patient, who gave verbal consent to proceed.  History of Present Illness        Shelby Patel is a 63 year old female who presents with follow up elevated blood pressure, osteopenia, elevated cholesterol, allergic rash and concerns about medication allergies. Also here to establish care in our office  Approximately two weeks ago, she developed a severe rash over Labor Day weekend, which was widespread and accompanied by facial swelling and slight oral bleeding. Initial treatment at urgent care in [place]  included prednisone  and Allegra , but her symptoms worsened, leading to further evaluation at [facility]. Blood work was performed, and a Armed forces operational officer confirmed a drug reaction through a biopsy, suspecting Celebrex  as the cause, although not definitively confirmed. Patient has never had issues with Sulfa in the past so not sure if it's sulfa allergy rather than NSAID allergy  She never took Celebrex  in the past until having neck and back pain, which led to a severe diffuse allergic reaction. Celebrex  has  been added to her allergy list. She has previously taken NSAIDs like Advil  and naproxen without issues, suggesting the reaction may be related to the sulfa component in Celebrex . Patient saw Dermatology and biopsy was performed and was told that  it was a drug reaction (not sure if DRESS syndrome or not)  She has a history of neck pain, which was evaluated with an angiogram in March due to tinnitus. The neck pain radiated to her shoulder and was initially severe, causing her to wake up crying, but it has since improved by 85%. She has used ibuprofen , Tylenol , and oxycodone  for pain management, with limited relief from NSAIDs. Patient wonders if she should do MRI cervical spine  Her blood pressure has been elevated, with readings SBP 150-160 range the past month or so. She has a family history of high blood pressure and heart disease. She follows a diet influenced by her husband's keto diet, which includes high salt intake. Patient not having any headaches or visual disturbance.   She has a history of elevated cholesterol, with a family history of heart disease. Has never taken cholesterol medication but is open to the idea.   She has a history of osteopenia, with bone density tests scheduled for next year due to insurance constraints. She is up to date on her mammograms and colonoscopy, with the latter being on a five-year plan due to previous polyps.  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 Patient has not noticed the tinnitus like before and thinks she is focusing on other health issues. Patient did see ENT re this with negative evaluation Physical Exam VITALS: BP- 136/100 MUSCULOSKELETAL: Shoulder strength 5/5 bilaterally. Neck tightness present. Results LABS Calcium : Elevated (02/11/2024) Cholesterol: Elevated LDL, high HDL (02/11/2024) Kidney Function: Normal (02/11/2024) Anemia: Normal (02/11/2024)  RADIOLOGY Neck Angiography: No aneurysm, mild arthritis, no blockage in neck arteries (08/2023) Lung Nodules: Left upper lobe right 5 mm, low risk for malignancy, no routine follow-up recommended  (08/2023)  PATHOLOGY Biopsy: Drug reaction (02/16/2024) Assessment & Plan Hypertension new onset Blood pressure elevated, family history present, asymptomatic. - Start low-dose antihypertensive medication at 5 mg. - Monitor blood pressure regularly. DASH diet encouraged - Follow up in a few weeks to assess response.  Hyperlipidemia Elevated LDL with family history of heart disease. Discussed statin benefits and calcium  score test option. - Start low-dose statin at Crestor  10 mg nightly. - Recheck cholesterol levels in 4-6 weeks. - Consider calcium  score test if hesitant about statin.  Cervical spondylosis Mild arthritis with significant symptom improvement, no further imaging needed unless symptoms worsen. - Consider physical therapy if symptoms worsen. - Monitor symptoms and consider imaging if significant change.  Tinnitus left sided Intermittent left ear ringing, previous angiogram normal, mild arthritis not causative. - Monitor symptoms. - Imaging has been performed and patient saw ENT re this.   Hypercalcemia Mild Slightly elevated calcium , previous levels normal, no related history. - Monitor calcium  levels.  The ASCVD Risk score (Arnett DK, et al., 2019) failed to calculate for the following reasons:   The valid HDL cholesterol range is 20 to 100 mg/dL  Past Medical History:  Diagnosis Date   Diverticulitis    Osteopenia    Vitamin D  deficiency    Past Surgical History:  Procedure Laterality Date   BREAST EXCISIONAL BIOPSY Left 2008   COLPOSCOPY     EXTERNAL EAR SURGERY  Left    FOOT SURGERY Bilateral    KNEE SURGERY     KNEE SURGERY Right    Social History   Tobacco Use   Smoking status: Never   Smokeless tobacco: Never  Substance Use Topics   Alcohol use: Yes    Comment: occassional   Drug use: No   Family History  Problem Relation Age of Onset   Cancer Mother    Heart disease Mother    Hyperlipidemia Mother    Hypertension Mother    Varicose  Veins Mother    Cancer Father    Hyperlipidemia Father    Hypertension Father    Hypertension Brother    Kidney disease Brother    Hyperlipidemia Brother    Heart disease Maternal Grandfather    Allergies  Allergen Reactions   Celebrex  [Celecoxib ] Rash    Pt reports a rash from head to toe, swelling and itching.   Penicillins     rash    ROS    Objective:    BP (!) 130/100   Pulse 85   Temp 98.5 F (36.9 C)   Ht 5' 8 (1.727 m)   Wt 119 lb (54 kg)   SpO2 99%   BMI 18.09 kg/m  BP Readings from Last 3 Encounters:  02/24/24 (!) 130/100  02/11/24 (!) 175/99  02/10/24 (!) 157/96   Wt Readings from Last 3 Encounters:  02/24/24 119 lb (54 kg)  02/11/24 122 lb (55.3 kg)  10/06/23 122 lb (55.3 kg)    Physical Exam Vitals and nursing note reviewed.  Constitutional:      General: She is not in acute distress.    Appearance: Normal appearance.  HENT:     Head: Normocephalic.     Right Ear: Tympanic membrane, ear canal and external ear normal.     Left Ear: Tympanic membrane, ear canal and external ear normal.  Eyes:     Extraocular Movements: Extraocular movements intact.     Conjunctiva/sclera: Conjunctivae normal.     Pupils: Pupils are equal, round, and reactive to light.  Cardiovascular:     Rate and Rhythm: Normal rate and regular rhythm.     Heart sounds: Normal heart sounds.  Pulmonary:     Effort: Pulmonary effort is normal.     Breath sounds: Normal breath sounds.  Musculoskeletal:     Cervical back: Spasms and tenderness present. No pain with movement.     Right lower leg: No edema.     Left lower leg: No edema.  Skin:    Findings: Erythema and rash present.     Comments: Still has residual erythematous rash over abdomen and back area from previous allergic reaction   Neurological:     General: No focal deficit present.     Mental Status: She is alert and oriented to person, place, and time.     Motor: No weakness.  Psychiatric:        Mood and  Affect: Mood normal.        Behavior: Behavior normal.        Thought Content: Thought content normal.        Judgment: Judgment normal.      Results for orders placed or performed in visit on 02/24/24  HM PAP SMEAR  Result Value Ref Range   HM Pap smear negative

## 2024-03-22 ENCOUNTER — Encounter: Payer: Self-pay | Admitting: Family Medicine

## 2024-03-22 ENCOUNTER — Ambulatory Visit (INDEPENDENT_AMBULATORY_CARE_PROVIDER_SITE_OTHER): Admitting: Family Medicine

## 2024-03-22 VITALS — BP 122/88 | HR 85 | Temp 98.7°F | Ht 68.0 in | Wt 120.4 lb

## 2024-03-22 DIAGNOSIS — I10 Essential (primary) hypertension: Secondary | ICD-10-CM | POA: Diagnosis not present

## 2024-03-22 DIAGNOSIS — M8580 Other specified disorders of bone density and structure, unspecified site: Secondary | ICD-10-CM | POA: Diagnosis not present

## 2024-03-22 DIAGNOSIS — E785 Hyperlipidemia, unspecified: Secondary | ICD-10-CM

## 2024-03-22 DIAGNOSIS — Z79899 Other long term (current) drug therapy: Secondary | ICD-10-CM

## 2024-03-22 NOTE — Progress Notes (Signed)
 Patient Office Visit  Assessment & Plan:  Hypertension, unspecified type -     Comprehensive metabolic panel with GFR  Hyperlipidemia, unspecified hyperlipidemia type -     Lipid panel  Osteopenia, unspecified location  Taking a statin medication -     Comprehensive metabolic panel with GFR   Assessment and Plan    Essential hypertension Blood pressure improved to 140/88 mmHg. Target is 120/80 mmHg. No dizziness or swelling reported. - Continue amlodipine  5 mg. - Consider increasing amlodipine  to 7.5 mg if home readings remain elevated, noting potential ankle swelling. - Advise on increased water intake and reduced salt consumption. - Monitor home blood pressure and report elevated readings.  Hyperlipidemia On rosuvastatin  with muscle cramps, a potential side effect. No severe muscle pain. Discussed side effects and management strategies. - Continue rosuvastatin  as prescribed. - Order cholesterol panel. - Advise on dietary modifications to include more vegetables.  Osteopenia Insurance issues prevent bone density scan coverage. Taking vitamin D  supplements. - Continue vitamin D  supplementation.  General Health Maintenance Up to date on mammograms, colonoscopies, flu, and pneumonia vaccinations. Engages in regular physical activity. - Ensure regular health screenings remain up to date. - Encourage continued physical activity and balanced diet.      Patient will let us  know if she thinks we need to increase the amlodipine  to 7.5 mg once a day.  Patient will continue to check blood pressures at home.  Continue the DASH diet and consistent exercise.  Return in about 3 months (around 06/22/2024).   Subjective:    Patient ID: Shelby Patel, female    DOB: [birth year]  Age: 63 y.o. MRN: [id number]  Chief Complaint  Patient presents with   Medical Management of Chronic Issues    HPI Discussed the use of AI scribe software for clinical note transcription with the patient,  who gave verbal consent to proceed.  History of Present Illness       Shelby Patel is a 63 year old female with hypertension who presents for blood pressure management and follow up on elevated cholesterol. patient also has osteopenia.  Her blood pressure readings have improved, with recent measurements around 140/88 mmHg at home compared to previous higher readings. She has not checked her blood pressure in the past week but notes that the diastolic number has decreased from 99-100 to 88. No dizziness or swelling of the ankles is reported. patient taking Norvasc  5mg  once per day  She is currently taking amlodipine  for blood pressure and rosuvastatin  for cholesterol management. She experiences muscle cramps, particularly in her knees, which she associates with her cholesterol medication. She has tried drinking pickle juice and eating pickles to alleviate the cramps, with some success. Patient is much improved now  Her calcium  levels were noted to be slightly elevated at 10.5 mg/dL during a previous hospital visit in September. She wants to have it rechecked.   She is up to date with her flu shot, pneumonia vaccine, mammograms, and colonoscopies. She follows a diet that includes high protein, as her husband is on a keto diet, but she finds it challenging to maintain. She does not experience constipation and has recently resumed water aerobic classes, which she finds beneficial.  Results LABS Calcium : 10.5 (02/2024)  Assessment and Plan Essential hypertension Blood pressure improved to 140/88 mmHg. Target is 120/80 mmHg. No dizziness or swelling reported. - Continue amlodipine  5 mg. - Consider increasing amlodipine  to 7.5 mg if home readings remain elevated, noting potential ankle swelling. - Advise  on increased water intake and reduced salt consumption. - Monitor home blood pressure and report elevated readings.  Hyperlipidemia On rosuvastatin  with muscle cramps, a potential side effect. No  severe muscle pain. Discussed side effects and management strategies. - Continue rosuvastatin  as prescribed. - Order cholesterol panel. - Advise on dietary modifications to include more vegetables.  Osteopenia Insurance issues prevent bone density scan coverage. Taking vitamin D  supplements. - Continue vitamin D  supplementation.  General Health Maintenance Up to date on mammograms, colonoscopies, flu, and pneumonia vaccinations. Engages in regular physical activity. - Ensure regular health screenings remain up to date. - Encourage continued physical activity and balanced diet.    The ASCVD Risk score (Arnett DK, et al., 2019) failed to calculate for the following reasons:   The valid HDL cholesterol range is 20 to 100 mg/dL  Past Medical History:  Diagnosis Date   Diverticulitis    Osteopenia    Vitamin D  deficiency    Past Surgical History:  Procedure Laterality Date   BREAST EXCISIONAL BIOPSY Left 2008   COLPOSCOPY     EXTERNAL EAR SURGERY Left    FOOT SURGERY Bilateral    KNEE SURGERY     KNEE SURGERY Right    Social History   Tobacco Use   Smoking status: Never   Smokeless tobacco: Never  Substance Use Topics   Alcohol use: Yes    Comment: occassional   Drug use: No   Family History  Problem Relation Age of Onset   Cancer Mother    Heart disease Mother    Hyperlipidemia Mother    Hypertension Mother    Varicose Veins Mother    Cancer Father    Hyperlipidemia Father    Hypertension Father    Hypertension Brother    Kidney disease Brother    Hyperlipidemia Brother    Heart disease Maternal Grandfather    Allergies  Allergen Reactions   Celebrex  [Celecoxib ] Rash    Pt reports a rash from head to toe, swelling and itching.   Penicillins     rash    ROS    Objective:    BP 122/88   Pulse 85   Temp 98.7 F (37.1 C)   Ht 5' 8 (1.727 m)   Wt 120 lb 6 oz (54.6 kg)   SpO2 99%   BMI 18.30 kg/m  BP Readings from Last 3 Encounters:  03/22/24  122/88  02/24/24 (!) 130/100  02/11/24 (!) 175/99   Wt Readings from Last 3 Encounters:  03/22/24 120 lb 6 oz (54.6 kg)  02/24/24 119 lb (54 kg)  02/11/24 122 lb (55.3 kg)    Physical Exam Vitals and nursing note reviewed.  Constitutional:      Appearance: Normal appearance.  HENT:     Head: Normocephalic.     Right Ear: Tympanic membrane, ear canal and external ear normal.     Left Ear: Tympanic membrane, ear canal and external ear normal.  Eyes:     Extraocular Movements: Extraocular movements intact.     Conjunctiva/sclera: Conjunctivae normal.     Pupils: Pupils are equal, round, and reactive to light.  Cardiovascular:     Rate and Rhythm: Normal rate and regular rhythm.     Heart sounds: Normal heart sounds.  Pulmonary:     Effort: Pulmonary effort is normal.     Breath sounds: Normal breath sounds.  Musculoskeletal:     Right lower leg: No edema.     Left lower leg: No edema.  Neurological:     General: No focal deficit present.     Mental Status: She is alert and oriented to person, place, and time.  Psychiatric:        Mood and Affect: Mood normal.        Behavior: Behavior normal.        Thought Content: Thought content normal.        Judgment: Judgment normal.      No results found for any visits on 03/22/24.

## 2024-03-23 ENCOUNTER — Ambulatory Visit: Payer: Self-pay | Admitting: Family Medicine

## 2024-03-23 LAB — LIPID PANEL
Cholesterol: 235 mg/dL — ABNORMAL HIGH (ref <200)
HDL: 127 mg/dL (ref >=50)
LDL Cholesterol (Calc): 94 mg/dL
Non-HDL Cholesterol (Calc): 108 mg/dL (ref <130)
Total CHOL/HDL Ratio: 1.9 (calc) (ref <5.0)
Triglycerides: 56 mg/dL (ref <150)

## 2024-03-23 LAB — COMPREHENSIVE METABOLIC PANEL WITH GFR
AG Ratio: 1.7 (calc) (ref 1.0–2.5)
ALT: 24 U/L (ref 6–29)
AST: 24 U/L (ref 10–35)
Albumin: 4.8 g/dL (ref 3.6–5.1)
Alkaline phosphatase (APISO): 62 U/L (ref 37–153)
BUN: 17 mg/dL (ref 7–25)
CO2: 29 mmol/L (ref 20–32)
Calcium: 10.3 mg/dL (ref 8.6–10.4)
Chloride: 100 mmol/L (ref 98–110)
Creat: 0.76 mg/dL (ref 0.50–1.05)
Globulin: 2.8 g/dL (ref 1.9–3.7)
Glucose, Bld: 81 mg/dL (ref 65–99)
Potassium: 4.6 mmol/L (ref 3.5–5.3)
Sodium: 138 mmol/L (ref 135–146)
Total Bilirubin: 0.5 mg/dL (ref 0.2–1.2)
Total Protein: 7.6 g/dL (ref 6.1–8.1)
eGFR: 88 mL/min/1.73m2 (ref >=60)

## 2024-04-13 ENCOUNTER — Encounter: Payer: Self-pay | Admitting: Family Medicine

## 2024-04-13 ENCOUNTER — Ambulatory Visit: Admitting: Family Medicine

## 2024-04-13 ENCOUNTER — Ambulatory Visit: Payer: Self-pay

## 2024-04-13 VITALS — BP 126/86 | HR 75 | Temp 98.0°F | Ht 68.0 in | Wt 121.0 lb

## 2024-04-13 DIAGNOSIS — K589 Irritable bowel syndrome without diarrhea: Secondary | ICD-10-CM | POA: Diagnosis not present

## 2024-04-13 DIAGNOSIS — D72819 Decreased white blood cell count, unspecified: Secondary | ICD-10-CM | POA: Diagnosis not present

## 2024-04-13 DIAGNOSIS — W57XXXA Bitten or stung by nonvenomous insect and other nonvenomous arthropods, initial encounter: Secondary | ICD-10-CM | POA: Diagnosis not present

## 2024-04-13 DIAGNOSIS — M65942 Unspecified synovitis and tenosynovitis, left hand: Secondary | ICD-10-CM | POA: Diagnosis not present

## 2024-04-13 LAB — CBC WITH DIFFERENTIAL/PLATELET
Absolute Lymphocytes: 840 {cells}/uL — ABNORMAL LOW (ref 850–3900)
Absolute Monocytes: 441 {cells}/uL (ref 200–950)
Basophils Absolute: 30 {cells}/uL (ref 0–200)
Basophils Relative: 0.8 %
Eosinophils Absolute: 61 {cells}/uL (ref 15–500)
Eosinophils Relative: 1.6 %
HCT: 39.6 % (ref 35.0–45.0)
Hemoglobin: 13.2 g/dL (ref 11.7–15.5)
MCH: 33.8 pg — ABNORMAL HIGH (ref 27.0–33.0)
MCHC: 33.3 g/dL (ref 32.0–36.0)
MCV: 101.3 fL — ABNORMAL HIGH (ref 80.0–100.0)
MPV: 10.4 fL (ref 7.5–12.5)
Monocytes Relative: 11.6 %
Neutro Abs: 2428 {cells}/uL (ref 1500–7800)
Neutrophils Relative %: 63.9 %
Platelets: 280 Thousand/uL (ref 140–400)
RBC: 3.91 Million/uL (ref 3.80–5.10)
RDW: 12.4 % (ref 11.0–15.0)
Total Lymphocyte: 22.1 %
WBC: 3.8 Thousand/uL (ref 3.8–10.8)

## 2024-04-13 MED ORDER — CEPHALEXIN 500 MG PO CAPS: 500.0000 mg | ORAL_CAPSULE | Freq: Four times a day (QID) | ORAL | 0 refills | Status: AC

## 2024-04-13 NOTE — Progress Notes (Signed)
 Patient Office Visit  Assessment & Plan:  Insect bite, nonvenomous -     Cephalexin; Take 1 capsule (500 mg total) by mouth 4 (four) times daily for 10 days.  Dispense: 40 capsule; Refill: 0 -     CBC with Differential/Platelet  Tenosynovitis of left hand -     Cephalexin; Take 1 capsule (500 mg total) by mouth 4 (four) times daily for 10 days.  Dispense: 40 capsule; Refill: 0 -     Ambulatory referral to Orthopedic Surgery   Assessment and Plan    Left hand cellulitis and swelling following suspected insect or spider bite with possible tenosynovitis Acute swelling and cellulitis of the left hand, particularly affecting the pinky finger, following a suspected insect or spider bite. Differential diagnosis includes possible tenosynovitis. Concern for progression to serious infection requiring orthopedic intervention. Allergy to penicillin noted, but sulfa drugs tolerated. - Prescribed antibiotics avoiding penicillin. - Ordered CBC to check for elevated white cell count. - Consider orthopedic referral if no improvement in 48 hours or worsening condition. - Confirmed tetanus vaccination status.     Patient will let us  know if she is worsening. May need to go to ED if left hand swelling/redness worsens in any way.   Return if symptoms worsen or fail to improve.   Subjective:    Patient ID: Shelby Patel, female    DOB: 05-08-1961  Age: 63 y.o. MRN: [id number]  Chief Complaint  Patient presents with   Joint Swelling    Pt noticed swelling to her L hand yesterday. She thinks she may have been bit by a spider.    HPI Discussed the use of AI scribe software for clinical note transcription with the patient, who gave verbal consent to proceed.  History of Present Illness        History of Present Illness Shelby Patel is a 63 year old female who presents with a swollen and painful left pinky finger, suspected to be a spider bite but not sure, started to notice swelling and redness  yesterday  She believes she was bitten by a spider on her left pinky finger in the early hours of the previous day. Initially, the area was red and has since become swollen. The area now appears to have a white spot that was previously red.  She describes the pain as a level 4 out of 10, noting that it feels tight due to the swelling. No fever or chills. She has not taken any medication for the pain, such as Advil  or Tylenol .  She has experienced difficulty removing her wedding ring due to the swelling. No numbness or tingling and she confirms she is right-handed.  She has a known allergy to penicillin, which causes a rash, and has previously taken cephalexin without issues for a urinary tract infection. She has also used Voltaren cream on the affected area without relief.  Her tetanus vaccination is up to date.  Physical Exam EXTREMITIES: Significant swelling and increased warmth on the left pinky and top of the hand, with redness on the left pinky.  Assessment and Plan Left hand cellulitis and swelling following suspected insect or spider bite with possible tenosynovitis Acute swelling and cellulitis of the left hand, particularly affecting the pinky finger, following a suspected insect or spider bite. Differential diagnosis includes possible tenosynovitis. Concern for progression to serious infection requiring orthopedic intervention. Allergy to penicillin noted, but sulfa drugs tolerated. - Prescribed antibiotics avoiding penicillin. - Ordered CBC to check for elevated white  cell count. - Consider orthopedic referral if no improvement in 48 hours or worsening condition. - Confirmed tetanus vaccination status.    The ASCVD Risk score (Arnett DK, et al., 2019) failed to calculate for the following reasons:   The valid HDL cholesterol range is 20 to 100 mg/dL  Past Medical History:  Diagnosis Date   Diverticulitis    Osteopenia    Vitamin D  deficiency    Past Surgical History:   Procedure Laterality Date   BREAST EXCISIONAL BIOPSY Left 2008   COLPOSCOPY     EXTERNAL EAR SURGERY Left    FOOT SURGERY Bilateral    KNEE SURGERY     KNEE SURGERY Right    Social History   Tobacco Use   Smoking status: Never   Smokeless tobacco: Never  Substance Use Topics   Alcohol use: Yes    Comment: occassional   Drug use: No   Family History  Problem Relation Age of Onset   Cancer Mother    Heart disease Mother    Hyperlipidemia Mother    Hypertension Mother    Varicose Veins Mother    Cancer Father    Hyperlipidemia Father    Hypertension Father    Hypertension Brother    Kidney disease Brother    Hyperlipidemia Brother    Heart disease Maternal Grandfather    Allergies  Allergen Reactions   Celebrex  [Celecoxib ] Rash    Pt reports a rash from head to toe, swelling and itching.   Penicillins     rash    ROS    Objective:    BP 126/86   Pulse 75   Temp 98 F (36.7 C)   Ht 5' 8 (1.727 m)   Wt 121 lb (54.9 kg)   SpO2 99%   BMI 18.40 kg/m  BP Readings from Last 3 Encounters:  04/13/24 126/86  03/22/24 122/88  02/24/24 (!) 130/100   Wt Readings from Last 3 Encounters:  04/13/24 121 lb (54.9 kg)  03/22/24 120 lb 6 oz (54.6 kg)  02/24/24 119 lb (54 kg)    Physical Exam Vitals and nursing note reviewed.  Constitutional:      General: She is not in acute distress.    Appearance: Normal appearance.  HENT:     Head: Normocephalic.     Right Ear: Tympanic membrane, ear canal and external ear normal.     Left Ear: Tympanic membrane, ear canal and external ear normal.  Eyes:     Extraocular Movements: Extraocular movements intact.     Conjunctiva/sclera: Conjunctivae normal.     Pupils: Pupils are equal, round, and reactive to light.  Cardiovascular:     Rate and Rhythm: Normal rate and regular rhythm.     Heart sounds: Normal heart sounds.  Pulmonary:     Effort: Pulmonary effort is normal.     Breath sounds: Normal breath sounds.   Musculoskeletal:     Right hand: Swelling and tenderness present. Decreased range of motion. Normal strength.     Right lower leg: No edema.     Left lower leg: No edema.     Comments: Left pinky red and swollen, warm to the touch, palmar surface edematous.top of the hand looks swollen compared to right hand.  Patient is able to make a fist.   Skin:    Findings: Erythema present. No rash.  Neurological:     General: No focal deficit present.     Mental Status: She is alert and oriented  to person, place, and time.  Psychiatric:        Mood and Affect: Mood normal.        Behavior: Behavior normal.        Thought Content: Thought content normal.        Judgment: Judgment normal.      No results found for any visits on 04/13/24.

## 2024-04-13 NOTE — Telephone Encounter (Signed)
 FYI Only or Action Required?: FYI only for provider: appointment scheduled on 11/4.  Patient was last seen in primary care on 03/22/2024 by Aletha Bene, MD.  Called Nurse Triage reporting Insect Bite.  Symptoms began yesterday.  Interventions attempted: OTC medications: Voltaren Cream .  Symptoms are: gradually worsening.  Triage Disposition: See PCP When Office is Open (Within 3 Days)  Patient/caregiver understands and will follow disposition?: Yes                        Copied from CRM #[id number]. Topic: Clinical - Red Word Triage >> Apr 13, 2024  8:25 AM Mia F wrote: Red Word that prompted transfer to Nurse Triage: Possible spider bite. Hand is very swollen and itchy. Started yesterday and noticed a red spot and now it looks like a blood blister. A little painful but more swollen and itchy more than anything. This is the left hand Reason for Disposition  [1] SEVERE local itching (e.g., interferes with work, school, sleep) AND [2] not improved after 24 hours of hydrocortisone cream  Bite starts to look bad (e.g., blister, purplish skin, ulcer)  (Exception: There is just minor swelling or small red bump.)  Answer Assessment - Initial Assessment Questions 1. TYPE of INSECT: What type of insect was it?      Spider, but she is unsure   2. ONSET: When did you get bitten?      Yesterday  3. LOCATION: Where is the insect bite located?      Left hand pinky finger, blood blister present  4. REDNESS: Is the area red or pink? If Yes, ask: What size is the area of redness? (inches or cm). When did the redness start?    Yes, mild    5. PAIN: Is there any pain? If Yes, ask: How bad is the pain? (Scale 0-10; or none, mild, moderate, severe)     No pain   6. ITCHING: Does it itch? If Yes, ask: How bad is the itch?      Yes, moderate  7. SWELLING: How big is the swelling? (e.g., inches, cm, or compare to coins)     Yes, unable to bend finger-  moderate-severe  8. OTHER SYMPTOMS: Do you have any other symptoms?  (e.g., difficulty breathing, fever, hives)  No.    Appt. Scheduled.  Protocols used: Insect Bite-A-AH

## 2024-04-14 ENCOUNTER — Ambulatory Visit: Payer: Self-pay | Admitting: Family Medicine

## 2024-04-26 ENCOUNTER — Other Ambulatory Visit: Payer: Self-pay | Admitting: Family Medicine

## 2024-04-26 DIAGNOSIS — E7800 Pure hypercholesterolemia, unspecified: Secondary | ICD-10-CM

## 2024-04-28 ENCOUNTER — Encounter: Payer: Self-pay | Admitting: Family Medicine

## 2024-04-28 ENCOUNTER — Other Ambulatory Visit: Payer: Self-pay

## 2024-04-28 DIAGNOSIS — I10 Essential (primary) hypertension: Secondary | ICD-10-CM

## 2024-04-28 MED ORDER — AMLODIPINE BESYLATE 5 MG PO TABS: 5.0000 mg | ORAL_TABLET | Freq: Every day | ORAL | 1 refills | Status: DC

## 2024-04-29 NOTE — Telephone Encounter (Signed)
 Requested Prescriptions  Pending Prescriptions Disp Refills   rosuvastatin  (CRESTOR ) 10 MG tablet [Pharmacy Med Name: ROSUVASTATIN  10MG  TABLETS] 90 tablet 1    Sig: TAKE 1 TABLET(10 MG) BY MOUTH DAILY     Cardiovascular:  Antilipid - Statins 2 Failed - 04/29/2024  1:21 PM      Failed - Lipid Panel in normal range within the last 12 months    Cholesterol, Total  Date Value Ref Range Status  07/29/2023 267 (H) 100 - 199 mg/dL Final   Cholesterol  Date Value Ref Range Status  03/22/2024 235 (H) <200 mg/dL Final   LDL Cholesterol (Calc)  Date Value Ref Range Status  03/22/2024 94 mg/dL (calc) Final    Comment:    Reference range: <100 . Desirable range <100 mg/dL for primary prevention;   <70 mg/dL for patients with CHD or diabetic patients  with > or = 2 CHD risk factors. SABRA LDL-C is now calculated using the Martin-Hopkins  calculation, which is a validated novel method providing  better accuracy than the Friedewald equation in the  estimation of LDL-C.  Gladis APPLETHWAITE et al. SANDREA. 7986;689(80): 2061-2068  (http://education.[facility].com/faq/FAQ164)    HDL  Date Value Ref Range Status  03/22/2024 127 > OR = 50 mg/dL Final  97/81/7974 888 >60 mg/dL Final   Triglycerides  Date Value Ref Range Status  03/22/2024 56 <150 mg/dL Final         Passed - Cr in normal range and within 360 days    Creat  Date Value Ref Range Status  03/22/2024 0.76 0.50 - 1.05 mg/dL Final         Passed - Patient is not pregnant      Passed - Valid encounter within last 12 months    Recent Outpatient Visits           2 weeks ago Insect bite, nonvenomous   [facility] Aletha Bene, MD   1 month ago Hypertension, unspecified type   [facility] Aletha Bene, MD   2 months ago Encounter to establish care   [facility] Aletha Bene, MD   9 months ago Routine adult health maintenance   [facility] at [facility], [place], [place]

## 2024-04-30 ENCOUNTER — Other Ambulatory Visit: Payer: Self-pay

## 2024-04-30 DIAGNOSIS — E7800 Pure hypercholesterolemia, unspecified: Secondary | ICD-10-CM

## 2024-04-30 MED ORDER — ROSUVASTATIN CALCIUM 10 MG PO TABS: 10.0000 mg | ORAL_TABLET | Freq: Every day | ORAL | 0 refills | Status: DC

## 2024-04-30 MED ORDER — ROSUVASTATIN CALCIUM 10 MG PO TABS
10.0000 mg | ORAL_TABLET | Freq: Every day | ORAL | 0 refills | Status: DC
Start: 2024-04-30 — End: 2024-04-30

## 2024-05-10 ENCOUNTER — Other Ambulatory Visit: Payer: Self-pay

## 2024-05-10 DIAGNOSIS — I10 Essential (primary) hypertension: Secondary | ICD-10-CM

## 2024-05-10 DIAGNOSIS — E7800 Pure hypercholesterolemia, unspecified: Secondary | ICD-10-CM

## 2024-05-10 MED ORDER — ROSUVASTATIN CALCIUM 10 MG PO TABS: 10.0000 mg | ORAL_TABLET | Freq: Every day | ORAL | 1 refills | Status: AC

## 2024-05-10 MED ORDER — AMLODIPINE BESYLATE 5 MG PO TABS: 5.0000 mg | ORAL_TABLET | Freq: Every day | ORAL | 1 refills | Status: AC

## 2024-07-27 ENCOUNTER — Ambulatory Visit: Admitting: Family Medicine
